# Patient Record
Sex: Female | Born: 1970
Health system: Southern US, Community
[De-identification: ages and names within clinical notes are randomized; demographics above are authoritative.]

## PROBLEM LIST (undated history)

## (undated) DIAGNOSIS — R87629 Unspecified abnormal cytological findings in specimens from vagina: Secondary | ICD-10-CM

## (undated) DIAGNOSIS — R011 Cardiac murmur, unspecified: Secondary | ICD-10-CM

## (undated) DIAGNOSIS — R87619 Unspecified abnormal cytological findings in specimens from cervix uteri: Secondary | ICD-10-CM

## (undated) DIAGNOSIS — L121 Cicatricial pemphigoid: Secondary | ICD-10-CM

## (undated) DIAGNOSIS — E039 Hypothyroidism, unspecified: Secondary | ICD-10-CM

## (undated) DIAGNOSIS — T8859XA Other complications of anesthesia, initial encounter: Secondary | ICD-10-CM

## (undated) DIAGNOSIS — I341 Nonrheumatic mitral (valve) prolapse: Secondary | ICD-10-CM

## (undated) DIAGNOSIS — T4145XA Adverse effect of unspecified anesthetic, initial encounter: Secondary | ICD-10-CM

## (undated) DIAGNOSIS — E079 Disorder of thyroid, unspecified: Secondary | ICD-10-CM

## (undated) DIAGNOSIS — R112 Nausea with vomiting, unspecified: Secondary | ICD-10-CM

## (undated) DIAGNOSIS — L129 Pemphigoid, unspecified: Secondary | ICD-10-CM

## (undated) DIAGNOSIS — Z9889 Other specified postprocedural states: Secondary | ICD-10-CM

## (undated) HISTORY — PX: COLPOSCOPY: SHX161

## (undated) HISTORY — DX: Unspecified abnormal cytological findings in specimens from vagina: R87.629

## (undated) HISTORY — PX: ABDOMINAL HYSTERECTOMY: SHX81

## (undated) HISTORY — PX: TUBAL LIGATION: SHX77

## (undated) HISTORY — DX: Unspecified abnormal cytological findings in specimens from cervix uteri: R87.619

## (undated) HISTORY — PX: CERVICAL DISC SURGERY: SHX588

## (undated) HISTORY — DX: Disorder of thyroid, unspecified: E07.9

## (undated) HISTORY — PX: OTHER SURGICAL HISTORY: SHX169

---
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
## 1997-04-28 DIAGNOSIS — L121 Cicatricial pemphigoid: Secondary | ICD-10-CM

## 1997-04-28 HISTORY — DX: Cicatricial pemphigoid: L12.1

## 2007-05-06 ENCOUNTER — Ambulatory Visit (HOSPITAL_COMMUNITY): Admission: RE | Admit: 2007-05-06 | Discharge: 2007-05-06 | Payer: Self-pay | Admitting: Gastroenterology

## 2009-02-12 ENCOUNTER — Ambulatory Visit (HOSPITAL_COMMUNITY): Admission: RE | Admit: 2009-02-12 | Discharge: 2009-02-12 | Payer: Self-pay | Admitting: Family Medicine

## 2009-02-19 ENCOUNTER — Encounter (HOSPITAL_COMMUNITY): Admission: RE | Admit: 2009-02-19 | Discharge: 2009-03-21 | Payer: Self-pay | Admitting: Family Medicine

## 2009-04-02 ENCOUNTER — Encounter (HOSPITAL_COMMUNITY): Admission: RE | Admit: 2009-04-02 | Discharge: 2009-04-24 | Payer: Self-pay | Admitting: Family Medicine

## 2010-01-18 ENCOUNTER — Ambulatory Visit (HOSPITAL_COMMUNITY): Admission: RE | Admit: 2010-01-18 | Discharge: 2010-01-18 | Payer: Self-pay | Admitting: Orthopedic Surgery

## 2010-02-06 ENCOUNTER — Encounter: Admission: RE | Admit: 2010-02-06 | Discharge: 2010-02-06 | Payer: Self-pay | Admitting: Neurosurgery

## 2010-03-04 ENCOUNTER — Other Ambulatory Visit: Admission: RE | Admit: 2010-03-04 | Discharge: 2010-03-04 | Payer: Self-pay | Admitting: Gynecology

## 2010-03-04 ENCOUNTER — Ambulatory Visit: Payer: Self-pay | Admitting: Women's Health

## 2010-05-28 ENCOUNTER — Other Ambulatory Visit (HOSPITAL_COMMUNITY): Payer: Self-pay | Admitting: Neurosurgery

## 2010-05-28 DIAGNOSIS — G935 Compression of brain: Secondary | ICD-10-CM

## 2010-06-04 ENCOUNTER — Ambulatory Visit (HOSPITAL_COMMUNITY)
Admission: RE | Admit: 2010-06-04 | Discharge: 2010-06-04 | Disposition: A | Discharge status: Home / Self Care | Payer: Commercial Managed Care - PPO | Source: Ambulatory Visit | Attending: Neurosurgery | Admitting: Neurosurgery

## 2010-06-04 ENCOUNTER — Ambulatory Visit (HOSPITAL_COMMUNITY)
Admission: RE | Admit: 2010-06-04 | Discharge: 2010-06-04 | Disposition: A | Discharge status: Home / Self Care | Payer: 59 | Source: Ambulatory Visit | Attending: Neurosurgery | Admitting: Neurosurgery

## 2010-06-04 ENCOUNTER — Other Ambulatory Visit (HOSPITAL_COMMUNITY): Payer: Self-pay | Admitting: Neurosurgery

## 2010-06-04 DIAGNOSIS — R51 Headache: Secondary | ICD-10-CM | POA: Insufficient documentation

## 2010-06-04 DIAGNOSIS — G935 Compression of brain: Secondary | ICD-10-CM

## 2010-06-04 MED ORDER — GADOBENATE DIMEGLUMINE 529 MG/ML IV SOLN
13.0000 mL | Freq: Once | INTRAVENOUS | Status: AC
  Administered 2010-06-04: 13 mL via INTRAVENOUS

## 2010-07-05 ENCOUNTER — Encounter (HOSPITAL_COMMUNITY)
Admission: RE | Admit: 2010-07-05 | Discharge: 2010-07-05 | Disposition: A | Discharge status: Home / Self Care | Payer: Commercial Managed Care - PPO | Source: Ambulatory Visit | Attending: Neurosurgery | Admitting: Neurosurgery

## 2010-07-05 DIAGNOSIS — Z01812 Encounter for preprocedural laboratory examination: Secondary | ICD-10-CM | POA: Insufficient documentation

## 2010-07-05 LAB — CBC
MCH: 29.8 pg (ref 26.0–34.0)
MCHC: 33.4 g/dL (ref 30.0–36.0)
MCV: 89.1 fL (ref 78.0–100.0)
Platelets: 382 10*3/uL (ref 150–400)
RDW: 12.5 % (ref 11.5–15.5)

## 2010-07-10 ENCOUNTER — Ambulatory Visit (HOSPITAL_COMMUNITY)
Admission: RE | Admit: 2010-07-10 | Discharge: 2010-07-10 | Disposition: A | Discharge status: Home / Self Care | Payer: 59 | Source: Ambulatory Visit | Attending: Neurosurgery | Admitting: Neurosurgery

## 2010-07-10 ENCOUNTER — Inpatient Hospital Stay (HOSPITAL_COMMUNITY): Payer: 59

## 2010-07-10 DIAGNOSIS — Z01812 Encounter for preprocedural laboratory examination: Secondary | ICD-10-CM | POA: Insufficient documentation

## 2010-07-10 DIAGNOSIS — M5020 Other cervical disc displacement, unspecified cervical region: Secondary | ICD-10-CM | POA: Insufficient documentation

## 2010-07-10 DIAGNOSIS — M5030 Other cervical disc degeneration, unspecified cervical region: Secondary | ICD-10-CM | POA: Insufficient documentation

## 2010-07-22 NOTE — Op Note (Signed)
NAMEGRAE, LEATHERS             ACCOUNT NO.:  [id number]  MEDICAL RECORD NO.:  [id number]           PATIENT TYPE:  I  LOCATION:  3533                         FACILITY:  [facility]  PHYSICIAN:  Cristi Loron, M.D.DATE OF BIRTH:  1970-09-29  DATE OF PROCEDURE:  07/10/2010 DATE OF DISCHARGE:  07/10/2010                              OPERATIVE REPORT   BRIEF HISTORY:  The patient is a 40 year old white female who suffered from chronic neck pain, arm pain, etc.  She has failed medical management and was worked up with a cervical MRI which demonstrated the patient had herniated disk and disk degeneration at C5-6 and C6-7.  I discussed various treatment options with the patient including surgery. She has weighed the risks, benefits, and alternatives to surgery and decided to proceed with the C5-6 and C6-7 anterior cervical diskectomy and fusion and plating.  PREOPERATIVE DIAGNOSES:  C5-6 and C6-7 herniated nucleus pulposus, cervicalgia, cervical radiculopathy.  POSTOPERATIVE DIAGNOSES:  C5-6 and C6-7 herniated nucleus pulposus, cervicalgia, cervical radiculopathy.  PROCEDURE:  C5-6 and C6-7 extensive anterior cervical diskectomy/decompression; C5-6 and C6-7 anterior interbody arthrodesis with local morselized autograft bone and Actifuse bone graft extender; insertion of C5-6 and C6-7 interbody prosthesis (Zimmer PEEK interbody prosthesis); and C5 to C7 anterior cervical plating with Globus titanium plate and screws.  SURGEON:  Cristi Loron, MD  ASSISTANT:  Hewitt Shorts, MD  ANESTHESIA:  General endotracheal.  ESTIMATED BLOOD LOSS:  100 mL.  SPECIMENS:  None.  DRAINS:  None.  COMPLICATIONS:  None.  DESCRIPTION OF PROCEDURE:  The patient was brought to the operating room by Anesthesia team.  General endotracheal anesthesia was induced.  The patient remained in supine position.  A roll was placed under shoulders deeper neck and in neutral position.  Her  anterior cervical region was then prepared with Betadine scrub and Betadine solution.  Sterile drapes were applied.  I then injected the area to be incised with Marcaine with epinephrine solution.  I used a scalpel to make a transverse incision in the patient's left anterior neck.  I used a Metzenbaum scissors to divide the patient's platysma muscle and then to dissect medial to sternocleidomastoid muscle, jugular vein, and carotid artery.  I carefully dissected down towards the anterior cervical spine identifying the esophagus retracting it medially.  I then used Kittner swab to clear soft tissue from the anterior cervical spine.  I then inserted a bent spinal needle into the upper exposed intervertebral disk space.  We obtained intraoperative radiograph to confirm our location.  We then used electrocautery to detach the medial border of the longus colli muscle bilaterally from C5-6 and C6-7 intervertebral disk space.  We inserted the Caspar self-retaining retractor underneath the longus colli muscle bilaterally to provide exposure. We being began decompression at C5-6.  We incised the C5-6 intervertebral disk with 15-blade scalpel and performed a partial intervertebral diskectomy with the pituitary forceps and the Carlen curettes.  We then inserted distraction screws at C6 and C7, distracted interspace, and then used high-speed drill to decorticate the vertebral endplates at C6-7, to drill away the remainder of C6-7 intervertebral disk, to drill  away some posterior spondylosis, and to thin out the posterior longitudinal ligament.  We then incised ligament with arachnoid knife and then removed it with a Kerrison punch undercutting the vertebral endplates at C6-7 decompressing the thecal sac.  We then performed foraminotomies about the bilateral C7 nerve root, completing decompression at this level.  We now turned attention to the arthrodesis.  I used trial spacers and determined to use  6-mm interbody prosthesis at both levels.  We prefilled these prosthesis with combination of local morselized autograft bone that we obtained during decompression as well as Actifuse bone graft extender.  We inserted the prosthesis and distracted interspaces at C5-6 and C6-7.  We then removed distraction screws. There was a good snug fit of the prosthesis at both levels.  We now turned our attention to the anterior spinal instrumentation.  We selected appropriate length Globus titanium plate and laid it along the anterior aspect of the vertebral bodies from C5 to C7.  We then drilled two 12-mm holes at C5, C6, and C7 and secured the plate to the vertebral bodies by placing two 12-mm self-tapping screws at C5, C6, and C7.  We got good bony purchase with each screw.  We then obtained intraoperative radiograph which demonstrated good position of plate, screws, and prosthesis.  We then secured the plate by locking each cam.  We then obtained hemostasis using bipolar electrocautery.  We irrigated the wound out with bacitracin solution.  We then removed the retractor. We inspected esophagus for any damage, none was not apparent.  We then reapproximated the patient's platysma muscle with interrupted 3-0 Vicryl suture, subcutaneous tissue with interrupted 3-0 Vicryl suture, and skin with Steri-Strips and Benzoin.  The wound was then coated with bacitracin ointment and a sterile dressing was applied.  The drapes were removed.  The patient was subsequently extubated by Anesthesia team and transported to Post Anesthesia Care Unit in stable condition.  All sponge, instrument, and needle counts were correct at the end of this case.     Cristi Loron, M.D.     JDJ/MEDQ  D:  07/10/2010  T:  07/11/2010  Job:  [id number]  Electronically Signed by Tressie Stalker M.D. on 07/22/2010 09:55:27 AM

## 2010-09-10 NOTE — Op Note (Signed)
[name], April Jordan             ACCOUNT NO.:  [id number]   MEDICAL RECORD NO.:  [id number]          PATIENT TYPE:  AMB   LOCATION:  ENDO                         FACILITY:  [facility]   PHYSICIAN:  John C. Madilyn Fireman, M.D.    DATE OF BIRTH:  April 28, 1971   DATE OF PROCEDURE:  05/06/2007  DATE OF DISCHARGE:                               OPERATIVE REPORT   INDICATION FOR PROCEDURE:  This patient with bullous pemphigoid who has  had oral lesions and recently had some dysphagia or odynophagia with  suspicion of this possibly representing pemphigoid lesions in her upper  esophagus.   PROCEDURE:  The patient was placed in the left lateral decubitus  position and placed on the pulse monitor with continuous low-flow oxygen  delivered by nasal cannula.  She was sedated with 100 mcg IV fentanyl  and 10 mg IV Versed.  The Olympus video endoscope was advanced under  direct vision into the oropharynx and esophagus.  The esophagus was  straight and of normal caliber with the squamocolumnar line at 38 cm.  There was no visible hiatal hernia, ring, stricture or other abnormality  of the GE junction.  In the proximal esophagus, there were a few,  probably no more than 6-7, white, raised plaques that were semi  adherent, consistent visually with Candida.  I did not see any other  mucosal disruption to suggest pemphigoid, and the mucosa otherwise  appeared normal.  There was no upper esophageal ring or stricture or  diverticulum appreciated.  Brushings were taken of the apparent candidal  plaques, and the scope was then withdrawn.  The stomach and duodenum  were inspected and appeared to be within normal limits.  The patient was  then returned to the recovery room in stable condition.  She tolerated  the procedure well, and there were no immediate complications.   IMPRESSION:  Apparent mild candidal esophagitis consistent with prior  corticosteroid use for attempted pomphoid.   PLAN:  Await brushings, and we  will go ahead and start her on a course  of Diflucan.           ______________________________  Everardo All. Madilyn Fireman, M.D.     JCH/MEDQ  D:  05/06/2007  T:  05/06/2007  Job:  [id number]   cc:   Dr. Isaac Bliss

## 2011-01-16 LAB — BODY FLUID CULTURE: Gram Stain: NONE SEEN

## 2011-07-23 ENCOUNTER — Emergency Department (HOSPITAL_COMMUNITY)
Admission: EM | Admit: 2011-07-23 | Discharge: 2011-07-23 | Disposition: A | Discharge status: Home / Self Care | Payer: 59 | Source: Home / Self Care | Attending: Emergency Medicine | Admitting: Emergency Medicine

## 2011-07-23 ENCOUNTER — Encounter (HOSPITAL_COMMUNITY): Payer: Self-pay | Admitting: *Deleted

## 2011-07-23 DIAGNOSIS — J019 Acute sinusitis, unspecified: Secondary | ICD-10-CM

## 2011-07-23 DIAGNOSIS — H68012 Acute Eustachian salpingitis, left ear: Secondary | ICD-10-CM

## 2011-07-23 DIAGNOSIS — J309 Allergic rhinitis, unspecified: Secondary | ICD-10-CM

## 2011-07-23 DIAGNOSIS — H68019 Acute Eustachian salpingitis, unspecified ear: Secondary | ICD-10-CM

## 2011-07-23 MED ORDER — FLUTICASONE PROPIONATE 50 MCG/ACT NA SUSP: 2.0000 | Freq: Every day | NASAL | Status: AC

## 2011-07-23 MED ORDER — AMOXICILLIN-POT CLAVULANATE 875-125 MG PO TABS: 1.0000 | ORAL_TABLET | Freq: Two times a day (BID) | ORAL | Status: AC

## 2011-07-23 NOTE — ED Provider Notes (Signed)
Chief Complaint  Patient presents with  . Otalgia  . Nasal Congestion    History of Present Illness:   The patient is a 41 year old female with a two-week history of nasal congestion earache. She has a history of allergies. Her left ear has felt congested and slightly painful. There has been no drainage. She also describes nasal congestion with yellowish drainage, sinus pressure, headache, sneezing, itchy, watery eyes. She denies fever, chills, sore throat, or cough.  Review of Systems:  Other than noted above, the patient denies any of the following symptoms. Systemic:  No fever, chills, sweats, fatigue, myalgias, headache, or anorexia. Eye:  No redness, pain or drainage. ENT:  No earache, nasal congestion, rhinorrhea, sinus pressure, or sore throat. Lungs:  No cough, sputum production, wheezing, shortness of breath. Or chest pain. GI:  No nausea, vomiting, abdominal pain or diarrhea. Skin:  No rash or itching.  PMFSH:  Past medical history, family history, social history, meds, and allergies were reviewed.  Physical Exam:   Vital signs:  BP 116/67  Pulse 64  Temp(Src) 98.2 F (36.8 C) (Oral)  Resp 16  SpO2 99%  LMP 07/16/2011 General:  Alert, in no distress. Eye:  No conjunctival injection or drainage. ENT:  TMs and canals were normal, without erythema or inflammation.  Nasal mucosa was clear and uncongested, without drainage.  Mucous membranes were moist.  Pharynx was clear, without exudate or drainage.  There were no oral ulcerations or lesions. Neck:  Supple, no adenopathy, tenderness or mass. Lungs:  No respiratory distress.  Lungs were clear to auscultation, without wheezes, rales or rhonchi.  Breath sounds were clear and equal bilaterally. Heart:  Regular rhythm, without gallops, murmers or rubs. Skin:  Clear, warm, and dry, without rash or lesions.  Assessment:   Diagnoses that have been ruled out:  None  Diagnoses that are still under consideration:  None  Final  diagnoses:  Allergic rhinitis  Acute sinusitis  Acute Eustachian salpingitis, left ear      Plan:   1.  The following meds were prescribed:   New Prescriptions   AMOXICILLIN-CLAVULANATE (AUGMENTIN) 875-125 MG PER TABLET    Take 1 tablet by mouth 2 (two) times daily.   FLUTICASONE (FLONASE) 50 MCG/ACT NASAL SPRAY    Place 2 sprays into the nose daily.   2.  The patient was instructed in symptomatic care and handouts were given. 3.  The patient was told to return if becoming worse in any way, if no better in 3 or 4 days, and given some red flag symptoms that would indicate earlier return.   Reuben Likes, MD 07/23/11 2214

## 2011-07-23 NOTE — Discharge Instructions (Signed)
Allergic Rhinitis  Allergic rhinitis is when the mucous membranes in the nose respond to allergens. Allergens are particles in the air that cause your body to have an allergic reaction. This causes you to release allergic antibodies. Through a chain of events, these eventually cause you to release histamine into the blood stream (hence the use of antihistamines). Although meant to be protective to the body, it is this release that causes your discomfort, such as frequent sneezing, congestion and an itchy runny nose.    CAUSES    The pollen allergens may come from grasses, trees, and weeds. This is seasonal allergic rhinitis, or "hay fever." Other allergens cause year-round allergic rhinitis (perennial allergic rhinitis) such as house dust mite allergen, pet dander and mold spores.    SYMPTOMS     Nasal stuffiness (congestion).   Runny, itchy nose with sneezing and tearing of the eyes.   There is often an itching of the mouth, eyes and ears.  It cannot be cured, but it can be controlled with medications.  DIAGNOSIS    If you are unable to determine the offending allergen, skin or blood testing may find it.  TREATMENT     Avoid the allergen.   Medications and allergy shots (immunotherapy) can help.   Hay fever may often be treated with antihistamines in pill or nasal spray forms. Antihistamines block the effects of histamine. There are over-the-counter medicines that may help with nasal congestion and swelling around the eyes. Check with your caregiver before taking or giving this medicine.  If the treatment above does not work, there are many new medications your caregiver can prescribe. Stronger medications may be used if initial measures are ineffective. Desensitizing injections can be used if medications and avoidance fails. Desensitization is when a patient is given ongoing shots until the body becomes less sensitive to the allergen. Make sure you follow up with your caregiver if problems continue.   SEEK MEDICAL CARE IF:     You develop fever (more than 100.5 F (38.1 C).   You develop a cough that does not stop easily (persistent).   You have shortness of breath.   You start wheezing.   Symptoms interfere with normal daily activities.  Document Released: 01/07/2001 Document Revised: 04/03/2011 Document Reviewed: 07/19/2008  Exit[employer] Patient Information 2012 [employer]    Sinusitis  Sinuses are air pockets within the bones of your face. The growth of bacteria within a sinus leads to infection. The infection prevents the sinuses from draining. This infection is called sinusitis.  SYMPTOMS    There will be different areas of pain depending on which sinuses have become infected.   The maxillary sinuses often produce pain beneath the eyes.   Frontal sinusitis may cause pain in the middle of the forehead and above the eyes.  Other problems (symptoms) include:   Toothaches.   Colored, pus-like (purulent) drainage from the nose.   Swelling, warmth, and tenderness over the sinus areas may be signs of infection.  TREATMENT    Sinusitis is most often determined by an exam.X-rays may be taken. If x-rays have been taken, make sure you obtain your results or find out how you are to obtain them. Your caregiver may give you medications (antibiotics). These are medications that will help kill the bacteria causing the infection. You may also be given a medication (decongestant) that helps to reduce sinus swelling.    HOME CARE INSTRUCTIONS     Only take over-the-counter or prescription medicines for   pain, discomfort, or fever as directed by your caregiver.   Drink extra fluids. Fluids help thin the mucus so your sinuses can drain more easily.   Applying either moist heat or ice packs to the sinus areas may help relieve discomfort.   Use saline nasal sprays to help moisten your sinuses. The sprays can be found at your local drugstore.  SEEK IMMEDIATE MEDICAL CARE IF:   You have a fever.    You have increasing pain, severe headaches, or toothache.   You have nausea, vomiting, or drowsiness.   You develop unusual swelling around the face or trouble seeing.  MAKE SURE YOU:     Understand these instructions.   Will watch your condition.   Will get help right away if you are not doing well or get worse.  Document Released: 04/14/2005 Document Revised: 04/03/2011 Document Reviewed: 11/11/2006  [employer] Patient Information 2012 [employer].

## 2011-07-23 NOTE — ED Notes (Signed)
Pt with head congestion and left ear pain x 2 weeks

## 2013-02-23 ENCOUNTER — Other Ambulatory Visit (HOSPITAL_COMMUNITY): Payer: Self-pay | Admitting: Family Medicine

## 2013-02-23 DIAGNOSIS — Z139 Encounter for screening, unspecified: Secondary | ICD-10-CM

## 2013-03-07 ENCOUNTER — Ambulatory Visit (HOSPITAL_COMMUNITY)
Admission: RE | Admit: 2013-03-07 | Discharge: 2013-03-07 | Disposition: A | Discharge status: Home / Self Care | Payer: 59 | Source: Ambulatory Visit | Attending: Family Medicine | Admitting: Family Medicine

## 2013-03-07 DIAGNOSIS — Z1231 Encounter for screening mammogram for malignant neoplasm of breast: Secondary | ICD-10-CM | POA: Insufficient documentation

## 2013-03-07 DIAGNOSIS — Z139 Encounter for screening, unspecified: Secondary | ICD-10-CM

## 2013-03-10 ENCOUNTER — Encounter: Payer: Self-pay | Admitting: Women's Health

## 2013-03-10 ENCOUNTER — Other Ambulatory Visit: Payer: Self-pay | Admitting: Family Medicine

## 2013-03-10 ENCOUNTER — Other Ambulatory Visit (HOSPITAL_COMMUNITY)
Admission: RE | Admit: 2013-03-10 | Discharge: 2013-03-10 | Disposition: A | Discharge status: Home / Self Care | Payer: 59 | Source: Ambulatory Visit | Attending: Women's Health | Admitting: Women's Health

## 2013-03-10 ENCOUNTER — Ambulatory Visit (INDEPENDENT_AMBULATORY_CARE_PROVIDER_SITE_OTHER): Payer: 59 | Admitting: Women's Health

## 2013-03-10 VITALS — BP 112/70 | Ht 62.25 in | Wt 144.6 lb

## 2013-03-10 DIAGNOSIS — R928 Other abnormal and inconclusive findings on diagnostic imaging of breast: Secondary | ICD-10-CM

## 2013-03-10 DIAGNOSIS — Z01419 Encounter for gynecological examination (general) (routine) without abnormal findings: Secondary | ICD-10-CM | POA: Insufficient documentation

## 2013-03-10 DIAGNOSIS — Z1151 Encounter for screening for human papillomavirus (HPV): Secondary | ICD-10-CM | POA: Insufficient documentation

## 2013-03-10 DIAGNOSIS — E039 Hypothyroidism, unspecified: Secondary | ICD-10-CM | POA: Insufficient documentation

## 2013-03-10 NOTE — Patient Instructions (Signed)

## 2013-03-10 NOTE — Progress Notes (Signed)
April Jordan 01/19/1971 [id number]    History:    The patient presents for annual exam.  Monthly cycles/BTL. Mammogram last week breast noted to be dense and needs a ultrasound of right breast. Normal Pap history. Hypothyroid on Synthroid per primary care.   Past medical history, past surgical history, family history and social history were all reviewed and documented in the EPIC chart. Nurse in the ER. Marijean Niemann 17 has had gardasil.   ROS:  A  ROS was performed and pertinent positives and negatives are included in the history.  Exam:  Filed Vitals:   03/10/13 1409  BP: 112/70    General appearance:  Normal Head/Neck:  Normal, without cervical or supraclavicular adenopathy. Thyroid:  Symmetrical, normal in size, without palpable masses or nodularity. Respiratory  Effort:  Normal  Auscultation:  Clear without wheezing or rhonchi Cardiovascular  Auscultation:  Regular rate, without rubs, murmurs or gallops  Edema/varicosities:  Not grossly evident Abdominal  Soft,nontender, without masses, guarding or rebound.  Liver/spleen:  No organomegaly noted  Hernia:  None appreciated  Skin  Inspection:  Grossly normal  Palpation:  Grossly normal Neurologic/psychiatric  Orientation:  Normal with appropriate conversation.  Mood/affect:  Normal  Genitourinary    Breasts: Examined lying and sitting.     Right: Without masses, retractions, discharge or axillary adenopathy.     Left: Without masses, retractions, discharge or axillary adenopathy.   Inguinal/mons:  Normal without inguinal adenopathy  External genitalia:  Normal  BUS/Urethra/Skene's glands:  Normal  Bladder:  Normal  Vagina:  Normal  Cervix:  Normal  Uterus:   normal in size, shape and contour.  Midline and mobile  Adnexa/parametria:     Rt: Without masses or tenderness.   Lt: Without masses or tenderness.  Anus and perineum: Normal  Digital rectal exam: Normal sphincter tone without palpated masses or  tenderness  Assessment/Plan:  42 y.o. MWF G1P1 for annual exam with no complaints.  Normal GYN exam/BTL Hypothyroid-primary care manages labs and meds  Plan: SBE's, continue annual mammogram, instructed to call to schedule ultrasound. 3-D tomography reviewed dense breasts. Exercise, calcium rich diet, vitamin D 1000 daily encouraged. Pap. Pap normal 2011, new screening guidelines reviewed.Marland Kitchen    Harrington Challenger WHNP, 3:10 PM 03/10/2013

## 2013-03-17 ENCOUNTER — Ambulatory Visit
Admission: RE | Admit: 2013-03-17 | Discharge: 2013-03-17 | Disposition: A | Discharge status: Home / Self Care | Payer: 59 | Source: Ambulatory Visit | Attending: Family Medicine | Admitting: Family Medicine

## 2013-03-17 DIAGNOSIS — R928 Other abnormal and inconclusive findings on diagnostic imaging of breast: Secondary | ICD-10-CM

## 2013-05-29 ENCOUNTER — Encounter (HOSPITAL_COMMUNITY): Payer: Self-pay | Admitting: Emergency Medicine

## 2013-05-29 ENCOUNTER — Emergency Department (HOSPITAL_COMMUNITY)
Admission: EM | Admit: 2013-05-29 | Discharge: 2013-05-29 | Disposition: A | Discharge status: Home / Self Care | Payer: 59 | Source: Home / Self Care | Attending: Family Medicine | Admitting: Family Medicine

## 2013-05-29 DIAGNOSIS — J069 Acute upper respiratory infection, unspecified: Secondary | ICD-10-CM

## 2013-05-29 MED ORDER — HYDROCOD POLST-CHLORPHEN POLST 10-8 MG/5ML PO LQCR: 5.0000 mL | Freq: Two times a day (BID) | ORAL | Status: DC | PRN

## 2013-05-29 MED ORDER — IPRATROPIUM BROMIDE 0.06 % NA SOLN: 2.0000 | Freq: Four times a day (QID) | NASAL | Status: DC

## 2013-05-29 NOTE — ED Provider Notes (Signed)
CSN: [id number]     Arrival date & time 05/29/13  1105 History   First MD Initiated Contact with Patient 05/29/13 1128     Chief Complaint  Patient presents with  . URI   (Consider location/radiation/quality/duration/timing/severity/associated sxs/prior Treatment) Patient is a 43 y.o. female presenting with URI. The history is provided by the patient.  URI Presenting symptoms: congestion, cough and rhinorrhea   Presenting symptoms: no fever and no sore throat   Severity:  Mild Onset quality:  Gradual Duration:  6 days Progression:  Unchanged Chronicity:  New Risk factors: recent illness and sick contacts     Past Medical History  Diagnosis Date  . Thyroid disease     hypothyroid   Past Surgical History  Procedure Laterality Date  . Cervical disc surgery    . Tubal ligation    . Uterine ablation     History reviewed. No pertinent family history. History  Substance Use Topics  . Smoking status: Never Smoker   . Smokeless tobacco: Not on file  . Alcohol Use: Yes     Comment: socially   OB History   Grav Para Term Preterm Abortions TAB SAB Ect Mult Living   1 1        1      Review of Systems  Constitutional: Negative.  Negative for fever.  HENT: Positive for congestion, postnasal drip and rhinorrhea. Negative for sore throat.   Respiratory: Positive for cough. Negative for shortness of breath.   Cardiovascular: Negative.   Gastrointestinal: Negative.     Allergies  Amoxicillin and Sulfa antibiotics  Home Medications   Current Outpatient Rx  Name  Route  Sig  Dispense  Refill  . chlorpheniramine-HYDROcodone (TUSSIONEX PENNKINETIC ER) 10-8 MG/5ML LQCR   Oral   Take 5 mLs by mouth every 12 (twelve) hours as needed for cough.   115 mL   1   . ipratropium (ATROVENT) 0.06 % nasal spray   Each Nare   Place 2 sprays into both nostrils 4 (four) times daily.   15 mL   1   . levothyroxine (SYNTHROID, LEVOTHROID) 137 MCG tablet   Oral   Take 137 mcg by mouth  daily.          BP 128/88  Pulse 80  Temp(Src) 98.1 F (36.7 C) (Oral)  Resp 18  SpO2 100%  LMP 07/16/2011 Physical Exam  Nursing note and vitals reviewed. Constitutional: She is oriented to person, place, and time. She appears well-developed and well-nourished. No distress.  HENT:  Head: Normocephalic.  Right Ear: External ear normal.  Left Ear: External ear normal.  Mouth/Throat: Oropharynx is clear and moist.  Eyes: Pupils are equal, round, and reactive to light.  Neck: Normal range of motion. Neck supple.  Cardiovascular: Normal rate, regular rhythm, normal heart sounds and intact distal pulses.   Pulmonary/Chest: Effort normal and breath sounds normal.  Lymphadenopathy:    She has no cervical adenopathy.  Neurological: She is alert and oriented to person, place, and time.  Skin: Skin is warm and dry.    ED Course  Procedures (including critical care time) Labs Review Labs Reviewed - No data to display Imaging Review No results found.    MDM      Linna HoffJames D Kindl, MD 05/29/13 1154

## 2013-05-29 NOTE — Discharge Instructions (Signed)
Drink plenty of fluids as discussed, use medicine as prescribed, and mucinex or delsym for cough. Return or see your doctor if further problems °

## 2013-05-29 NOTE — ED Notes (Signed)
Pt  Reports  Symptoms  Of  Cough  /  Congestion          With  Some  Sinus  Drainage    As  Well   Pt  Reports  Symptoms  X  6  Days   persistant and  Will  Not  Clear  Up    She  Ambulated  To  Room with steady  Fluid  Gait

## 2013-06-01 DIAGNOSIS — Z0289 Encounter for other administrative examinations: Secondary | ICD-10-CM

## 2013-06-27 ENCOUNTER — Other Ambulatory Visit (HOSPITAL_COMMUNITY): Payer: Self-pay | Admitting: Family Medicine

## 2013-06-27 DIAGNOSIS — R1011 Right upper quadrant pain: Secondary | ICD-10-CM

## 2013-06-28 ENCOUNTER — Other Ambulatory Visit (HOSPITAL_COMMUNITY): Payer: Self-pay | Admitting: Family Medicine

## 2013-06-28 ENCOUNTER — Ambulatory Visit (HOSPITAL_COMMUNITY)
Admission: RE | Admit: 2013-06-28 | Discharge: 2013-06-28 | Disposition: A | Discharge status: Home / Self Care | Payer: 59 | Source: Ambulatory Visit | Attending: Family Medicine | Admitting: Family Medicine

## 2013-06-28 DIAGNOSIS — R109 Unspecified abdominal pain: Secondary | ICD-10-CM | POA: Insufficient documentation

## 2013-06-28 DIAGNOSIS — R1011 Right upper quadrant pain: Secondary | ICD-10-CM

## 2013-06-29 ENCOUNTER — Other Ambulatory Visit (HOSPITAL_COMMUNITY): Payer: 59

## 2013-07-01 ENCOUNTER — Encounter (HOSPITAL_COMMUNITY): Payer: Self-pay

## 2013-07-01 ENCOUNTER — Encounter (HOSPITAL_COMMUNITY)
Admission: RE | Admit: 2013-07-01 | Discharge: 2013-07-01 | Disposition: A | Discharge status: Home / Self Care | Payer: 59 | Source: Ambulatory Visit | Attending: Family Medicine | Admitting: Family Medicine

## 2013-07-01 DIAGNOSIS — R1011 Right upper quadrant pain: Secondary | ICD-10-CM | POA: Insufficient documentation

## 2013-07-01 MED ORDER — SINCALIDE 5 MCG IJ SOLR
INTRAMUSCULAR | Status: AC
  Administered 2013-07-01: 1.27 ug
  Filled 2013-07-01: qty 5

## 2013-07-01 MED ORDER — STERILE WATER FOR INJECTION IJ SOLN
INTRAMUSCULAR | Status: AC
  Administered 2013-07-01: 5 mL
  Filled 2013-07-01: qty 10

## 2013-07-01 MED ORDER — TECHNETIUM TC 99M MEBROFENIN IV KIT
5.0000 | PACK | Freq: Once | INTRAVENOUS | Status: AC | PRN
  Administered 2013-07-01: 5 via INTRAVENOUS

## 2013-07-08 ENCOUNTER — Encounter (HOSPITAL_COMMUNITY)
Admission: RE | Admit: 2013-07-08 | Discharge: 2013-07-08 | Disposition: A | Discharge status: Home / Self Care | Payer: 59 | Source: Ambulatory Visit | Attending: General Surgery | Admitting: General Surgery

## 2013-07-08 ENCOUNTER — Encounter (HOSPITAL_COMMUNITY): Payer: Self-pay

## 2013-07-08 ENCOUNTER — Encounter (HOSPITAL_COMMUNITY): Payer: Self-pay | Admitting: Pharmacy Technician

## 2013-07-08 DIAGNOSIS — Z01812 Encounter for preprocedural laboratory examination: Secondary | ICD-10-CM | POA: Insufficient documentation

## 2013-07-08 HISTORY — DX: Other complications of anesthesia, initial encounter: T88.59XA

## 2013-07-08 HISTORY — DX: Nausea with vomiting, unspecified: Z98.890

## 2013-07-08 HISTORY — DX: Cardiac murmur, unspecified: R01.1

## 2013-07-08 HISTORY — DX: Hypothyroidism, unspecified: E03.9

## 2013-07-08 HISTORY — DX: Adverse effect of unspecified anesthetic, initial encounter: T41.45XA

## 2013-07-08 HISTORY — DX: Other specified postprocedural states: R11.2

## 2013-07-08 HISTORY — DX: Cicatricial pemphigoid: L12.1

## 2013-07-08 HISTORY — DX: Nonrheumatic mitral (valve) prolapse: I34.1

## 2013-07-08 LAB — CBC WITH DIFFERENTIAL/PLATELET
BASOS ABS: 0 10*3/uL (ref 0.0–0.1)
Basophils Relative: 1 % (ref 0–1)
EOS ABS: 0.1 10*3/uL (ref 0.0–0.7)
EOS PCT: 2 % (ref 0–5)
HCT: 38.3 % (ref 36.0–46.0)
Hemoglobin: 12.9 g/dL (ref 12.0–15.0)
LYMPHS ABS: 1.8 10*3/uL (ref 0.7–4.0)
LYMPHS PCT: 30 % (ref 12–46)
MCH: 30.6 pg (ref 26.0–34.0)
MCHC: 33.7 g/dL (ref 30.0–36.0)
MCV: 91 fL (ref 78.0–100.0)
Monocytes Absolute: 0.6 10*3/uL (ref 0.1–1.0)
Monocytes Relative: 10 % (ref 3–12)
NEUTROS PCT: 57 % (ref 43–77)
Neutro Abs: 3.4 10*3/uL (ref 1.7–7.7)
Platelets: 425 10*3/uL — ABNORMAL HIGH (ref 150–400)
RBC: 4.21 MIL/uL (ref 3.87–5.11)
RDW: 12.8 % (ref 11.5–15.5)
WBC: 5.9 10*3/uL (ref 4.0–10.5)

## 2013-07-08 LAB — HEPATIC FUNCTION PANEL
ALK PHOS: 48 U/L (ref 39–117)
ALT: 17 U/L (ref 0–35)
AST: 22 U/L (ref 0–37)
Albumin: 4.3 g/dL (ref 3.5–5.2)
Total Bilirubin: 0.4 mg/dL (ref 0.3–1.2)
Total Protein: 8 g/dL (ref 6.0–8.3)

## 2013-07-08 LAB — BASIC METABOLIC PANEL
BUN: 7 mg/dL (ref 6–23)
CALCIUM: 9.7 mg/dL (ref 8.4–10.5)
CO2: 26 meq/L (ref 19–32)
CREATININE: 0.99 mg/dL (ref 0.50–1.10)
Chloride: 100 mEq/L (ref 96–112)
GFR calc Af Amer: 80 mL/min — ABNORMAL LOW (ref >90)
GFR calc non Af Amer: 69 mL/min — ABNORMAL LOW (ref >90)
Glucose, Bld: 89 mg/dL (ref 70–99)
Potassium: 4.9 mEq/L (ref 3.7–5.3)
Sodium: 138 mEq/L (ref 137–147)

## 2013-07-08 NOTE — H&P (Signed)
  NTS SOAP Note  Vital Signs:  Vitals as of: 07/07/2013: Systolic 129: Diastolic 81: Heart Rate 73: Temp 98.87F: Height 105ft 2in: Weight 142Lbs 0 Ounces: Pain Level 1: BMI 25.97  BMI : 25.97 kg/m2  Subjective: This 89 Years 1 Months old Female presents for of right upper quadrant abdominal pain.  Has been having right upper quadrant abdominal pain, nausea, and fatty food intolerance for some time now.  No fever, chills, jaundice.  U/S of gallbladder negative.  HIDA shows low gallbladder ejection fraction.  Review of Symptoms:  Constitutional:unremarkable      headache Eyes:unremarkable   Nose/Mouth/Throat:unremarkable Cardiovascular:  unremarkable   Respiratory:unremarkable   Gastrointestin    abdominal pain,nausea,heartburn Genitourinary:unremarkable     Musculoskeletal:unremarkable   Skin:unremarkable Hematolgic/Lymphatic:unremarkable     Allergic/Immunologic:unremarkable     Past Medical History:    Reviewed  Past Medical History  Surgical History: BTL, neck fusion Medical Problems: hypothyroidism Allergies: amoxicillin, bactrum, levaquin Medications: synthroid   Social History:Reviewed  Social History  Preferred Language: English Race:  White Ethnicity: Not Hispanic / Latino Age: [age] Years 1 Months Marital Status:  M Alcohol: occassionally Recreational drug(s): no   Smoking Status: Never smoker reviewed on 07/07/2013 Functional Status reviewed on 07/07/2013 ------------------------------------------------ Bathing: Normal Cooking: Normal Dressing: Normal Driving: Normal Eating: Normal Managing Meds: Normal Oral Care: Normal Shopping: Normal Toileting: Normal Transferring: Normal Walking: Normal Cognitive Status reviewed on 07/07/2013 ------------------------------------------------ Attention: Normal Decision Making: Normal Language: Normal Memory: Normal Motor: Normal Perception: Normal Problem Solving:  Normal Visual and Spatial: Normal   Family History:  Reviewed  Family Health History Mother, Living; Healthy; healthy Father, Living; Healthy; healthy    Objective Information: General:  Well appearing, well nourished in no distress.   no scleral icterus Heart:  RRR, no murmur Lungs:    CTA bilaterally, no wheezes, rhonchi, rales.  Breathing unlabored. Abdomen:Soft, minimal tenderness in right upper quadrant to palpation, ND, no HSM, no masses.  Assessment:Chronic cholecystitis  Diagnoses: 575.11 Chronic cholecystitis (Chronic cholecystitis)  Procedures: [id number] - OFFICE OUTPATIENT NEW 30 MINUTES    Plan:  Scheduled for laparoscopic cholecystectomy on 07/13/13.   Patient Education:Alternative treatments to surgery were discussed with patient (and family).  Risks and benefits  of procedure including bleeding, infection, hepatobiliary injury, and the possibility of an open procedure were fully explained to the patient (and family) who gave informed consent. Patient/family questions were addressed.  Follow-up:Pending Surgery

## 2013-07-08 NOTE — Patient Instructions (Signed)
April DoomJennifer S Jordan  07/08/2013   Your procedure is scheduled on:  07/13/2013  Report to [facility] at  0845  AM.  Call this number if you have problems the morning of surgery: [phone]   Remember:   Do not eat food or drink liquids after midnight.   Take these medicines the morning of surgery with A SIP OF WATER:  synthroid   Do not wear jewelry, make-up or nail polish.  Do not wear lotions, powders, or perfumes.   Do not shave 48 hours prior to surgery. Men may shave face and neck.  Do not bring valuables to the hospital.  [facility] is not responsible for any belongings or valuables.               Contacts, dentures or bridgework may not be worn into surgery.  Leave suitcase in the car. After surgery it may be brought to your room.  For patients admitted to the hospital, discharge time is determined by your treatment team.               Patients discharged the day of surgery will not be allowed to drive home.  Name and phone number of your driver: family  Special Instructions: Shower using CHG 2 nights before surgery and the night before surgery.  If you shower the day of surgery use CHG.  Use special wash - you have one bottle of CHG for all showers.  You should use approximately 1/3 of the bottle for each shower.   Please read over the following fact sheets that you were given: Pain Booklet, Coughing and Deep Breathing, Surgical Site Infection Prevention, Anesthesia Post-op Instructions and Care and Recovery After Surgery Laparoscopic Cholecystectomy Laparoscopic cholecystectomy is surgery to remove the gallbladder. The gallbladder is located in the upper right part of the abdomen, behind the liver. It is a storage sac for bile produced in the liver. Bile aids in the digestion and absorption of fats. Cholecystectomy is often done for inflammation of the gallbladder (cholecystitis). This condition is usually caused by a buildup of gallstones (cholelithiasis) in your  gallbladder. Gallstones can block the flow of bile, resulting in inflammation and pain. In severe cases, emergency surgery may be required. When emergency surgery is not required, you will have time to prepare for the procedure. Laparoscopic surgery is an alternative to open surgery. Laparoscopic surgery has a shorter recovery time. Your common bile duct may also need to be examined during the procedure. If stones are found in the common bile duct, they may be removed. LET [facility] PROVIDER KNOW ABOUT:  Any allergies you have.  All medicines you are taking, including vitamins, herbs, eye drops, creams, and over-the-counter medicines.  Previous problems you or members of your family have had with the use of anesthetics.  Any blood disorders you have.  Previous surgeries you have had.  Medical conditions you have. RISKS AND COMPLICATIONS Generally, this is a safe procedure. However, as with any procedure, complications can occur. Possible complications include:  Infection.  Damage to the common bile duct, nerves, arteries, veins, or other internal organs such as the stomach, liver, or intestines.  Bleeding.  A stone may remain in the common bile duct.  A bile leak from the cyst duct that is clipped when your gallbladder is removed.  The need to convert to open surgery, which requires a larger incision in the abdomen. This may be necessary if your surgeon thinks it is  not safe to continue with a laparoscopic procedure. BEFORE THE PROCEDURE  Ask your health care provider about changing or stopping any regular medicines. You will need to stop taking aspirin or blood thinners at least 5 days prior to surgery.  Do not eat or drink anything after midnight the night before surgery.  Let your health care provider know if you develop a cold or other infectious problem before surgery. PROCEDURE   You will be given medicine to make you sleep through the procedure (general  anesthetic). A breathing tube will be placed in your mouth.  When you are asleep, your surgeon will make several small cuts (incisions) in your abdomen.  A thin, lighted tube with a tiny camera on the end (laparoscope) is inserted through one of the small incisions. The camera on the laparoscope sends a picture to a TV screen in the operating room. This gives the surgeon a good view inside your abdomen.  A gas will be pumped into your abdomen. This expands your abdomen so that the surgeon has more room to perform the surgery.  Other tools needed for the procedure are inserted through the other incisions. The gallbladder is removed through one of the incisions.  After the removal of your gallbladder, the incisions will be closed with stitches, staples, or skin glue. AFTER THE PROCEDURE  You will be taken to a recovery area where your progress will be checked often.  You may be allowed to go home the same day if your pain is controlled and you can tolerate liquids. Document Released: 04/14/2005 Document Revised: 02/02/2013 Document Reviewed: 11/24/2012 [facility] Patient Information 2014 [place], [place]. PATIENT INSTRUCTIONS POST-ANESTHESIA  IMMEDIATELY FOLLOWING SURGERY:  Do not drive or operate machinery for the first twenty four hours after surgery.  Do not make any important decisions for twenty four hours after surgery or while taking narcotic pain medications or sedatives.  If you develop intractable nausea and vomiting or a severe headache please notify your doctor immediately.  FOLLOW-UP:  Please make an appointment with your surgeon as instructed. You do not need to follow up with anesthesia unless specifically instructed to do so.  WOUND CARE INSTRUCTIONS (if applicable):  Keep a dry clean dressing on the anesthesia/puncture wound site if there is drainage.  Once the wound has quit draining you may leave it open to air.  Generally you should leave the bandage intact for twenty four  hours unless there is drainage.  If the epidural site drains for more than 36-48 hours please call the anesthesia department.  QUESTIONS?:  Please feel free to call your physician or the hospital operator if you have any questions, and they will be happy to assist you.

## 2013-07-13 ENCOUNTER — Encounter (HOSPITAL_COMMUNITY): Payer: 59 | Admitting: Anesthesiology

## 2013-07-13 ENCOUNTER — Encounter (HOSPITAL_COMMUNITY)
Admission: RE | Disposition: A | Discharge status: Home / Self Care | Payer: Self-pay | Source: Ambulatory Visit | Attending: General Surgery

## 2013-07-13 ENCOUNTER — Ambulatory Visit (HOSPITAL_COMMUNITY): Payer: 59 | Admitting: Anesthesiology

## 2013-07-13 ENCOUNTER — Ambulatory Visit (HOSPITAL_COMMUNITY)
Admission: RE | Admit: 2013-07-13 | Discharge: 2013-07-13 | Disposition: A | Discharge status: Home / Self Care | Payer: 59 | Source: Ambulatory Visit | Attending: General Surgery | Admitting: General Surgery

## 2013-07-13 ENCOUNTER — Encounter (HOSPITAL_COMMUNITY): Payer: Self-pay | Admitting: *Deleted

## 2013-07-13 DIAGNOSIS — K811 Chronic cholecystitis: Secondary | ICD-10-CM | POA: Insufficient documentation

## 2013-07-13 HISTORY — PX: CHOLECYSTECTOMY: SHX55

## 2013-07-13 SURGERY — LAPAROSCOPIC CHOLECYSTECTOMY
Anesthesia: General | Site: Abdomen

## 2013-07-13 MED ORDER — OXYCODONE-ACETAMINOPHEN 5-325 MG PO TABS
ORAL_TABLET | ORAL | Status: AC
  Filled 2013-07-13: qty 1

## 2013-07-13 MED ORDER — POVIDONE-IODINE 10 % OINT PACKET
TOPICAL_OINTMENT | CUTANEOUS | Status: DC | PRN
  Administered 2013-07-13: 2 via TOPICAL

## 2013-07-13 MED ORDER — MIDAZOLAM HCL 2 MG/2ML IJ SOLN
1.0000 mg | INTRAMUSCULAR | Status: DC | PRN
  Administered 2013-07-13: 2 mg via INTRAVENOUS
  Filled 2013-07-13: qty 2

## 2013-07-13 MED ORDER — LIDOCAINE HCL 1 % IJ SOLN
INTRAMUSCULAR | Status: DC | PRN
  Administered 2013-07-13: 30 mg via INTRADERMAL

## 2013-07-13 MED ORDER — GLYCOPYRROLATE 0.2 MG/ML IJ SOLN
0.2000 mg | Freq: Once | INTRAMUSCULAR | Status: AC
  Administered 2013-07-13: 0.2 mg via INTRAVENOUS
  Filled 2013-07-13: qty 1

## 2013-07-13 MED ORDER — OXYCODONE-ACETAMINOPHEN 7.5-325 MG PO TABS: 1.0000 | ORAL_TABLET | ORAL | Status: DC | PRN

## 2013-07-13 MED ORDER — CHLORHEXIDINE GLUCONATE 4 % EX LIQD: 1.0000 "application " | Freq: Once | CUTANEOUS | Status: DC

## 2013-07-13 MED ORDER — FENTANYL CITRATE 0.05 MG/ML IJ SOLN
INTRAMUSCULAR | Status: AC
  Filled 2013-07-13: qty 5

## 2013-07-13 MED ORDER — PROPOFOL 10 MG/ML IV BOLUS
INTRAVENOUS | Status: DC | PRN
  Administered 2013-07-13: 150 mg via INTRAVENOUS

## 2013-07-13 MED ORDER — POVIDONE-IODINE 10 % EX OINT
TOPICAL_OINTMENT | CUTANEOUS | Status: AC
  Filled 2013-07-13: qty 2

## 2013-07-13 MED ORDER — ROCURONIUM BROMIDE 100 MG/10ML IV SOLN
INTRAVENOUS | Status: DC | PRN
  Administered 2013-07-13: 5 mg via INTRAVENOUS
  Administered 2013-07-13: 20 mg via INTRAVENOUS

## 2013-07-13 MED ORDER — FENTANYL CITRATE 0.05 MG/ML IJ SOLN
INTRAMUSCULAR | Status: DC | PRN
  Administered 2013-07-13 (×5): 50 ug via INTRAVENOUS

## 2013-07-13 MED ORDER — HEMOSTATIC AGENTS (NO CHARGE) OPTIME
TOPICAL | Status: DC | PRN
  Administered 2013-07-13: 1 via TOPICAL

## 2013-07-13 MED ORDER — ONDANSETRON HCL 4 MG/2ML IJ SOLN
4.0000 mg | Freq: Once | INTRAMUSCULAR | Status: AC | PRN
  Administered 2013-07-13: 4 mg via INTRAVENOUS

## 2013-07-13 MED ORDER — GLYCOPYRROLATE 0.2 MG/ML IJ SOLN
INTRAMUSCULAR | Status: DC | PRN
  Administered 2013-07-13: .6 mg via INTRAVENOUS

## 2013-07-13 MED ORDER — FENTANYL CITRATE 0.05 MG/ML IJ SOLN: 25.0000 ug | INTRAMUSCULAR | Status: DC | PRN

## 2013-07-13 MED ORDER — ONDANSETRON HCL 4 MG/2ML IJ SOLN
4.0000 mg | Freq: Once | INTRAMUSCULAR | Status: AC
  Administered 2013-07-13: 4 mg via INTRAVENOUS
  Filled 2013-07-13: qty 2

## 2013-07-13 MED ORDER — DEXAMETHASONE SODIUM PHOSPHATE 4 MG/ML IJ SOLN
4.0000 mg | Freq: Once | INTRAMUSCULAR | Status: AC
  Administered 2013-07-13: 4 mg via INTRAVENOUS
  Filled 2013-07-13: qty 1

## 2013-07-13 MED ORDER — BUPIVACAINE HCL (PF) 0.5 % IJ SOLN
INTRAMUSCULAR | Status: DC | PRN
  Administered 2013-07-13: 10 mL

## 2013-07-13 MED ORDER — OXYCODONE-ACETAMINOPHEN 5-325 MG PO TABS
1.0000 | ORAL_TABLET | Freq: Once | ORAL | Status: AC
  Administered 2013-07-13: 1 via ORAL

## 2013-07-13 MED ORDER — CLINDAMYCIN PHOSPHATE 600 MG/50ML IV SOLN
600.0000 mg | Freq: Once | INTRAVENOUS | Status: AC
  Administered 2013-07-13: 600 mg via INTRAVENOUS
  Filled 2013-07-13: qty 50

## 2013-07-13 MED ORDER — PROPOFOL 10 MG/ML IV EMUL
INTRAVENOUS | Status: AC
  Filled 2013-07-13: qty 20

## 2013-07-13 MED ORDER — SUCCINYLCHOLINE CHLORIDE 20 MG/ML IJ SOLN
INTRAMUSCULAR | Status: DC | PRN
  Administered 2013-07-13: 100 mg via INTRAVENOUS

## 2013-07-13 MED ORDER — SUCCINYLCHOLINE CHLORIDE 20 MG/ML IJ SOLN
INTRAMUSCULAR | Status: AC
  Filled 2013-07-13: qty 1

## 2013-07-13 MED ORDER — GLYCOPYRROLATE 0.2 MG/ML IJ SOLN
INTRAMUSCULAR | Status: AC
  Filled 2013-07-13: qty 3

## 2013-07-13 MED ORDER — SCOPOLAMINE 1 MG/3DAYS TD PT72
1.0000 | MEDICATED_PATCH | Freq: Once | TRANSDERMAL | Status: DC
  Administered 2013-07-13: 1.5 mg via TRANSDERMAL
  Filled 2013-07-13: qty 1

## 2013-07-13 MED ORDER — SODIUM CHLORIDE 0.9 % IR SOLN
Status: DC | PRN
  Administered 2013-07-13: 1000 mL

## 2013-07-13 MED ORDER — NEOSTIGMINE METHYLSULFATE 1 MG/ML IJ SOLN
INTRAMUSCULAR | Status: DC | PRN
  Administered 2013-07-13: 3 mg via INTRAVENOUS

## 2013-07-13 MED ORDER — KETOROLAC TROMETHAMINE 30 MG/ML IJ SOLN
30.0000 mg | Freq: Once | INTRAMUSCULAR | Status: AC
  Administered 2013-07-13: 30 mg via INTRAVENOUS
  Filled 2013-07-13: qty 1

## 2013-07-13 MED ORDER — LACTATED RINGERS IV SOLN
INTRAVENOUS | Status: DC
  Administered 2013-07-13: 10:00:00 via INTRAVENOUS

## 2013-07-13 MED ORDER — ONDANSETRON HCL 4 MG/2ML IJ SOLN
INTRAMUSCULAR | Status: AC
  Filled 2013-07-13: qty 2

## 2013-07-13 MED ORDER — ROCURONIUM BROMIDE 50 MG/5ML IV SOLN
INTRAVENOUS | Status: AC
  Filled 2013-07-13: qty 1

## 2013-07-13 MED ORDER — BUPIVACAINE HCL (PF) 0.5 % IJ SOLN
INTRAMUSCULAR | Status: AC
  Filled 2013-07-13: qty 30

## 2013-07-13 MED ORDER — LIDOCAINE HCL (PF) 1 % IJ SOLN
INTRAMUSCULAR | Status: AC
  Filled 2013-07-13: qty 5

## 2013-07-13 SURGICAL SUPPLY — 43 items
APPLIER CLIP LAPSCP 10X32 DD (CLIP) ×2 IMPLANT
BAG HAMPER (MISCELLANEOUS) ×2 IMPLANT
CLOTH BEACON ORANGE TIMEOUT ST (SAFETY) ×2 IMPLANT
COVER LIGHT HANDLE STERIS (MISCELLANEOUS) ×4 IMPLANT
DECANTER SPIKE VIAL GLASS SM (MISCELLANEOUS) ×2 IMPLANT
DURAPREP 26ML APPLICATOR (WOUND CARE) ×2 IMPLANT
ELECT REM PT RETURN 9FT ADLT (ELECTROSURGICAL) ×2
ELECTRODE REM PT RTRN 9FT ADLT (ELECTROSURGICAL) ×1 IMPLANT
FILTER SMOKE EVAC LAPAROSHD (FILTER) ×2 IMPLANT
FORMALIN 10 PREFIL 120ML (MISCELLANEOUS) ×2 IMPLANT
GLOVE BIO SURGEON STRL SZ7.5 (GLOVE) ×2 IMPLANT
GLOVE BIOGEL PI IND STRL 7.0 (GLOVE) ×2 IMPLANT
GLOVE BIOGEL PI IND STRL 8 (GLOVE) ×1 IMPLANT
GLOVE BIOGEL PI INDICATOR 7.0 (GLOVE) ×2
GLOVE BIOGEL PI INDICATOR 8 (GLOVE) ×1
GLOVE ECLIPSE 6.5 STRL STRAW (GLOVE) ×2 IMPLANT
GLOVE EXAM NITRILE MD LF STRL (GLOVE) ×2 IMPLANT
GLOVE SS BIOGEL STRL SZ 6.5 (GLOVE) ×1 IMPLANT
GLOVE SUPERSENSE BIOGEL SZ 6.5 (GLOVE) ×1
GOWN STRL REUS W/TWL LRG LVL3 (GOWN DISPOSABLE) ×6 IMPLANT
HEMOSTAT SNOW SURGICEL 2X4 (HEMOSTASIS) ×2 IMPLANT
INST SET LAPROSCOPIC AP (KITS) ×2 IMPLANT
IV NS IRRIG 3000ML ARTHROMATIC (IV SOLUTION) IMPLANT
KIT ROOM TURNOVER APOR (KITS) ×2 IMPLANT
MANIFOLD NEPTUNE II (INSTRUMENTS) ×2 IMPLANT
NEEDLE INSUFFLATION 14GA 120MM (NEEDLE) ×2 IMPLANT
NS IRRIG 1000ML POUR BTL (IV SOLUTION) ×2 IMPLANT
PACK LAP CHOLE LZT030E (CUSTOM PROCEDURE TRAY) ×2 IMPLANT
PAD ARMBOARD 7.5X6 YLW CONV (MISCELLANEOUS) ×2 IMPLANT
POUCH SPECIMEN RETRIEVAL 10MM (ENDOMECHANICALS) ×2 IMPLANT
SET BASIN LINEN APH (SET/KITS/TRAYS/PACK) ×2 IMPLANT
SET TUBE IRRIG SUCTION NO TIP (IRRIGATION / IRRIGATOR) IMPLANT
SLEEVE ENDOPATH XCEL 5M (ENDOMECHANICALS) ×2 IMPLANT
SPONGE GAUZE 2X2 8PLY STRL LF (GAUZE/BANDAGES/DRESSINGS) ×8 IMPLANT
STAPLER VISISTAT (STAPLE) ×2 IMPLANT
SUT VICRYL 0 UR6 27IN ABS (SUTURE) ×2 IMPLANT
TAPE CLOTH SURG 4X10 WHT LF (GAUZE/BANDAGES/DRESSINGS) ×2 IMPLANT
TROCAR ENDO BLADELESS 11MM (ENDOMECHANICALS) ×2 IMPLANT
TROCAR XCEL NON-BLD 5MMX100MML (ENDOMECHANICALS) ×2 IMPLANT
TROCAR XCEL UNIV SLVE 11M 100M (ENDOMECHANICALS) ×2 IMPLANT
TUBING INSUFFLATION (TUBING) ×2 IMPLANT
WARMER LAPAROSCOPE (MISCELLANEOUS) ×2 IMPLANT
YANKAUER SUCT 12FT TUBE ARGYLE (SUCTIONS) ×2 IMPLANT

## 2013-07-13 NOTE — Interval H&P Note (Signed)
History and Physical Interval Note:  07/13/2013 9:39 AM  April DoomJennifer S Whitelaw  has presented today for surgery, with the diagnosis of chronic cholecystitis  The various methods of treatment have been discussed with the patient and family. After consideration of risks, benefits and other options for treatment, the patient has consented to  Procedure(s): LAPAROSCOPIC CHOLECYSTECTOMY (N/A) as a surgical intervention .  The patient's history has been reviewed, patient examined, no change in status, stable for surgery.  I have reviewed the patient's chart and labs.  Questions were answered to the patient's satisfaction.     Franky MachoJENKINS,Bradyn Vassey A

## 2013-07-13 NOTE — Anesthesia Procedure Notes (Signed)
Procedure Name: Intubation Date/Time: 07/13/2013 10:14 AM Performed by: Glynn OctaveANIEL, Mellonie Guess E Pre-anesthesia Checklist: Patient identified, Patient being monitored, Timeout performed, Emergency Drugs available and Suction available Patient Re-evaluated:Patient Re-evaluated prior to inductionOxygen Delivery Method: Circle System Utilized Preoxygenation: Pre-oxygenation with 100% oxygen Intubation Type: IV induction Ventilation: Mask ventilation without difficulty Laryngoscope Size: Mac and 3 Grade View: Grade I Tube type: Oral Tube size: 7.0 mm Number of attempts: 1 Airway Equipment and Method: stylet Placement Confirmation: ETT inserted through vocal cords under direct vision,  positive ETCO2 and breath sounds checked- equal and bilateral Secured at: 21 cm Tube secured with: Tape Dental Injury: Teeth and Oropharynx as per pre-operative assessment

## 2013-07-13 NOTE — Anesthesia Preprocedure Evaluation (Signed)
Anesthesia Evaluation  Patient identified by MRN, date of birth, ID band Patient awake    Reviewed: Allergy & Precautions, H&P , NPO status , Patient's Chart, lab work & pertinent test results  History of Anesthesia Complications (+) PONV and history of anesthetic complications  Airway Mallampati: I TM Distance: >3 FB Neck ROM: Full    Dental  (+) Teeth Intact   Pulmonary neg pulmonary ROS,  breath sounds clear to auscultation        Cardiovascular + Valvular Problems/Murmurs MVP Rhythm:Regular Rate:Normal     Neuro/Psych  Headaches,    GI/Hepatic   Endo/Other  Hypothyroidism   Renal/GU      Musculoskeletal   Abdominal   Peds  Hematology   Anesthesia Other Findings   Reproductive/Obstetrics                           Anesthesia Physical Anesthesia Plan  ASA: II  Anesthesia Plan: General   Post-op Pain Management:    Induction: Intravenous  Airway Management Planned: Oral ETT  Additional Equipment:   Intra-op Plan:   Post-operative Plan: Extubation in OR  Informed Consent: I have reviewed the patients History and Physical, chart, labs and discussed the procedure including the risks, benefits and alternatives for the proposed anesthesia with the patient or authorized representative who has indicated his/her understanding and acceptance.     Plan Discussed with:   Anesthesia Plan Comments:         Anesthesia Quick Evaluation

## 2013-07-13 NOTE — Discharge Instructions (Signed)
Laparoscopic Cholecystectomy, Care After °Refer to this sheet in the next few weeks. These instructions provide you with information on caring for yourself after your procedure. Your health care provider may also give you more specific instructions. Your treatment has been planned according to current medical practices, but problems sometimes occur. Call your health care provider if you have any problems or questions after your procedure. °WHAT TO EXPECT AFTER THE PROCEDURE °After your procedure, it is typical to have the following: °· Pain at your incision sites. You will be given pain medicines to control the pain. °· Mild nausea or vomiting. This should improve after the first 24 hours. °· Bloating and possibly shoulder pain from the gas used during the procedure. This will improve after the first 24 hours. °HOME CARE INSTRUCTIONS  °· Change bandages (dressings) as directed by your health care provider. °· Keep the wound dry and clean. You may wash the wound gently with soap and water. Gently blot or dab the area dry. °· Do not take baths or use swimming pools or hot tubs for 2 weeks or until your health care provider approves. °· Only take over-the-counter or prescription medicines as directed by your health care provider. °· Continue your normal diet as directed by your health care provider. °· Do not lift anything heavier than 10 pounds (4.5 kg) until your health care provider approves. °· Do not play contact sports for 1 week or until your health care provider approves. °SEEK MEDICAL CARE IF:  °· You have redness, swelling, or increasing pain in the wound. °· You notice yellowish-white fluid (pus) coming from the wound. °· You have drainage from the wound that lasts longer than 1 day. °· You notice a bad smell coming from the wound or dressing. °· Your surgical cuts (incisions) break open. °SEEK IMMEDIATE MEDICAL CARE IF:  °· You develop a rash. °· You have difficulty breathing. °· You have chest pain. °· You  have a fever. °· You have increasing pain in the shoulders (shoulder strap areas). °· You have dizzy episodes or faint while standing. °· You have severe abdominal pain. °· You feel sick to your stomach (nauseous) or throw up (vomit) and this lasts for more than 1 day. °Document Released: 04/14/2005 Document Revised: 02/02/2013 Document Reviewed: 11/24/2012 °[employer]® Patient Information ©2014 [employer]. ° °

## 2013-07-13 NOTE — Op Note (Signed)
Patient:  April DoomJennifer S Jordan  DOB:  10/30/1970  MRN:  [id number]   Preop Diagnosis:  Chronic cholecystitis  Postop Diagnosis:  Same  Procedure:  Laparoscopic cholecystectomy  Surgeon:  Franky MachoMark Ariyon Gerstenberger, M.D.  Anes:  General endotracheal  Indications:  Patient is a 43 year old white female who presents with biliary colic secondary to chronic cholecystitis the risks and benefits of the procedure including bleeding, infection, hepatobiliary, and the possibility of an open procedure were fully explained to the patient, who gave informed consent.  Procedure note:  The patient is placed the supine position. After induction of general endotracheal anesthesia, the abdomen was prepped and draped using usual sterile technique with DuraPrep. Surgical site confirmation was performed.  An infraumbilical incision was made down to the fascia. A Veress needle was introduced into the abdominal cavity and confirmation of placement was done using the saline drop test. The abdomen was then insufflated to 16 mm mercury pressure. An 11 mm trocar was introduced into the abdominal cavity under direct visualization without difficulty. The patient was placed in reverse Trendelenburg position and additional 11 mm trocar was placed the epigastric region and 5 mm trochars were placed in the right upper quadrant and right flank regions. Liver was inspected and noted to be within normal limits. The gallbladder was retracted in a dynamic fashion in order to expose the triangle of Calot. The cystic duct was first identified. Its juncture to the infundibulum was fully identified. Endoclips were placed proximally and distally on the cystic duct, and the cystic duct was divided. This was likewise done the cystic artery. The gallbladder was then freed away from the gallbladder fossa using Bovie electrocautery. The gallbladder was delivered through the epigastric trocar site using an Endo Catch bag. The gallbladder fossa was inspected and  no abnormal bleeding or bile leakage was noted. Surgicel is placed the gallbladder fossa. All fluid and air were then evacuated from the abdominal cavity prior to removal of the trochars.  All wounds were irrigated with normal saline. All wounds were injected with 0.5% Sensorcaine. The infraumbilical fashion as well as epigastric fascia were reapproximated using 0 Vicryl interrupted sutures. All skin incisions were closed using staples. Betadine ointment and dry sterile dressings were applied.  All tape and needle counts were correct the end of the procedure. Patient was extubated in the operating room and transferred to PACU in stable condition.  Complications:  None  EBL:  Minimal  Specimen:  Gallbladder

## 2013-07-13 NOTE — Transfer of Care (Signed)
Immediate Anesthesia Transfer of Care Note  Patient: April Jordan  Procedure(s) Performed: Procedure(s): LAPAROSCOPIC CHOLECYSTECTOMY (N/A)  Patient Location: PACU  Anesthesia Type:General  Level of Consciousness: awake, alert  and oriented  Airway & Oxygen Therapy: Patient Spontanous Breathing and Patient connected to face mask oxygen  Post-op Assessment: Report given to PACU RN  Post vital signs: Reviewed and stable  Complications: No apparent anesthesia complications

## 2013-07-13 NOTE — Anesthesia Postprocedure Evaluation (Signed)
  Anesthesia Post-op Note  Patient: April Jordan  Procedure(s) Performed: Procedure(s): LAPAROSCOPIC CHOLECYSTECTOMY (N/A)  Patient Location: PACU  Anesthesia Type:General  Level of Consciousness: awake, alert  and oriented  Airway and Oxygen Therapy: Patient Spontanous Breathing and Patient connected to face mask oxygen  Post-op Pain: none  Post-op Assessment: Post-op Vital signs reviewed, Patient's Cardiovascular Status Stable, Respiratory Function Stable, Patent Airway and No signs of Nausea or vomiting  Post-op Vital Signs: Reviewed and stable  Complications: No apparent anesthesia complications

## 2013-07-14 ENCOUNTER — Encounter (HOSPITAL_COMMUNITY): Payer: Self-pay | Admitting: General Surgery

## 2014-02-27 ENCOUNTER — Encounter (HOSPITAL_COMMUNITY): Payer: Self-pay | Admitting: General Surgery

## 2014-03-07 ENCOUNTER — Other Ambulatory Visit: Payer: Self-pay

## 2014-03-07 DIAGNOSIS — Z1231 Encounter for screening mammogram for malignant neoplasm of breast: Secondary | ICD-10-CM

## 2014-04-06 ENCOUNTER — Ambulatory Visit
Admission: RE | Admit: 2014-04-06 | Discharge: 2014-04-06 | Disposition: A | Discharge status: Home / Self Care | Payer: 59 | Source: Ambulatory Visit

## 2014-04-06 DIAGNOSIS — Z1231 Encounter for screening mammogram for malignant neoplasm of breast: Secondary | ICD-10-CM

## 2014-04-18 ENCOUNTER — Encounter: Payer: Self-pay | Admitting: Women's Health

## 2014-04-18 ENCOUNTER — Ambulatory Visit (INDEPENDENT_AMBULATORY_CARE_PROVIDER_SITE_OTHER): Payer: 59 | Admitting: Women's Health

## 2014-04-18 VITALS — BP 136/78 | Ht 62.0 in | Wt 145.0 lb

## 2014-04-18 DIAGNOSIS — Z01419 Encounter for gynecological examination (general) (routine) without abnormal findings: Secondary | ICD-10-CM

## 2014-04-18 NOTE — Progress Notes (Signed)
April DoomJennifer S Crysler 12/03/1970 [id number]    History:    Presents for annual exam.  Amenorrheic 2 years/BTL/ablation 4 years ago, occasional hot flushes. Normal Pap and mammogram history. Hypothyroid primary care manages. 06/2013 cholecystectomy.  Past medical history, past surgical history, family history and social history were all reviewed and documented in the EPIC chart. Nurse in the ER. April MuirJamie 18 doing well.  ROS:  A ROS was performed and pertinent positives and negatives are included.  Exam:  Filed Vitals:   04/18/14 1359  BP: 136/78    General appearance:  Normal Thyroid:  Symmetrical, normal in size, without palpable masses or nodularity. Respiratory  Auscultation:  Clear without wheezing or rhonchi Cardiovascular  Auscultation:  Regular rate, without rubs, murmurs or gallops  Edema/varicosities:  Not grossly evident Abdominal  Soft,nontender, without masses, guarding or rebound.  Liver/spleen:  No organomegaly noted  Hernia:  None appreciated  Skin  Inspection:  Grossly normal   Breasts: Examined lying and sitting.     Right: Without masses, retractions, discharge or axillary adenopathy.     Left: Without masses, retractions, discharge or axillary adenopathy. Gentitourinary   Inguinal/mons:  Normal without inguinal adenopathy  External genitalia:  Normal  BUS/Urethra/Skene's glands:  Normal  Vagina:  Normal  Cervix:  Normal  Uterus:   normal in size, shape and contour.  Midline and mobile  Adnexa/parametria:     Rt: Without masses or tenderness.   Lt: Without masses or tenderness.  Anus and perineum: Normal  Digital rectal exam: Normal sphincter tone without palpated masses or tenderness  Assessment/Plan:  43 y.o.MWF G1P1  for annual exam with no complaints.  Amenorrheic/ablation/BTL Hypothyroid primary care manages labs and meds  Plan: SBE's, continue  annual screening mammogram, regular exercise/healthy lifestyle calcium rich diet, vitamin D 1000 daily  encouraged. UA, Pap 2014 ascus with negative HR HPV new screening guidelines reviewed.  Harrington ChallengerYOUNG,NANCY J WHNP, 5:02 PM 04/18/2014

## 2014-04-18 NOTE — Patient Instructions (Signed)

## 2014-04-19 LAB — URINALYSIS W MICROSCOPIC + REFLEX CULTURE
BACTERIA UA: NONE SEEN
BILIRUBIN URINE: NEGATIVE
CASTS: NONE SEEN
CRYSTALS: NONE SEEN
Glucose, UA: NEGATIVE mg/dL
Hgb urine dipstick: NEGATIVE
Ketones, ur: NEGATIVE mg/dL
NITRITE: NEGATIVE
PROTEIN: NEGATIVE mg/dL
Specific Gravity, Urine: <1.005 — ABNORMAL LOW (ref 1.005–1.030)
Squamous Epithelial / LPF: NONE SEEN
UROBILINOGEN UA: 0.2 mg/dL (ref 0.0–1.0)
pH: 6.5 (ref 5.0–8.0)

## 2014-04-22 LAB — URINE CULTURE

## 2015-05-15 ENCOUNTER — Other Ambulatory Visit (HOSPITAL_COMMUNITY): Payer: Self-pay | Admitting: Family Medicine

## 2015-05-15 DIAGNOSIS — Z1231 Encounter for screening mammogram for malignant neoplasm of breast: Secondary | ICD-10-CM

## 2015-05-23 ENCOUNTER — Ambulatory Visit (HOSPITAL_COMMUNITY)
Admission: RE | Admit: 2015-05-23 | Discharge: 2015-05-23 | Disposition: A | Discharge status: Home / Self Care | Payer: 59 | Source: Ambulatory Visit | Attending: Family Medicine | Admitting: Family Medicine

## 2015-05-23 DIAGNOSIS — Z1231 Encounter for screening mammogram for malignant neoplasm of breast: Secondary | ICD-10-CM | POA: Diagnosis not present

## 2016-04-22 DIAGNOSIS — J209 Acute bronchitis, unspecified: Secondary | ICD-10-CM | POA: Diagnosis not present

## 2016-04-22 DIAGNOSIS — Z6827 Body mass index (BMI) 27.0-27.9, adult: Secondary | ICD-10-CM | POA: Diagnosis not present

## 2016-05-19 ENCOUNTER — Other Ambulatory Visit (HOSPITAL_COMMUNITY): Payer: Self-pay | Admitting: Family Medicine

## 2016-05-19 DIAGNOSIS — Z1239 Encounter for other screening for malignant neoplasm of breast: Secondary | ICD-10-CM

## 2016-05-28 ENCOUNTER — Ambulatory Visit (HOSPITAL_COMMUNITY)
Admission: RE | Admit: 2016-05-28 | Discharge: 2016-05-28 | Disposition: A | Discharge status: Home / Self Care | Payer: 59 | Source: Ambulatory Visit | Attending: Family Medicine | Admitting: Family Medicine

## 2016-05-28 DIAGNOSIS — Z1231 Encounter for screening mammogram for malignant neoplasm of breast: Secondary | ICD-10-CM | POA: Diagnosis not present

## 2016-05-28 DIAGNOSIS — Z1239 Encounter for other screening for malignant neoplasm of breast: Secondary | ICD-10-CM

## 2016-05-29 DIAGNOSIS — H5203 Hypermetropia, bilateral: Secondary | ICD-10-CM | POA: Diagnosis not present

## 2016-05-29 DIAGNOSIS — H524 Presbyopia: Secondary | ICD-10-CM | POA: Diagnosis not present

## 2016-05-29 DIAGNOSIS — H52221 Regular astigmatism, right eye: Secondary | ICD-10-CM | POA: Diagnosis not present

## 2016-07-18 DIAGNOSIS — Z6826 Body mass index (BMI) 26.0-26.9, adult: Secondary | ICD-10-CM | POA: Diagnosis not present

## 2016-07-18 DIAGNOSIS — M545 Low back pain: Secondary | ICD-10-CM | POA: Diagnosis not present

## 2016-07-18 DIAGNOSIS — H1013 Acute atopic conjunctivitis, bilateral: Secondary | ICD-10-CM | POA: Diagnosis not present

## 2016-07-18 DIAGNOSIS — E039 Hypothyroidism, unspecified: Secondary | ICD-10-CM | POA: Diagnosis not present

## 2017-05-27 ENCOUNTER — Other Ambulatory Visit (HOSPITAL_COMMUNITY): Payer: Self-pay | Admitting: Family Medicine

## 2017-05-27 DIAGNOSIS — Z1231 Encounter for screening mammogram for malignant neoplasm of breast: Secondary | ICD-10-CM

## 2017-06-03 DIAGNOSIS — H52223 Regular astigmatism, bilateral: Secondary | ICD-10-CM | POA: Diagnosis not present

## 2017-06-03 DIAGNOSIS — H524 Presbyopia: Secondary | ICD-10-CM | POA: Diagnosis not present

## 2017-06-03 DIAGNOSIS — H5203 Hypermetropia, bilateral: Secondary | ICD-10-CM | POA: Diagnosis not present

## 2017-06-10 DIAGNOSIS — Z6826 Body mass index (BMI) 26.0-26.9, adult: Secondary | ICD-10-CM | POA: Diagnosis not present

## 2017-06-10 DIAGNOSIS — L309 Dermatitis, unspecified: Secondary | ICD-10-CM | POA: Diagnosis not present

## 2017-06-10 DIAGNOSIS — R197 Diarrhea, unspecified: Secondary | ICD-10-CM | POA: Diagnosis not present

## 2017-06-15 ENCOUNTER — Ambulatory Visit (HOSPITAL_COMMUNITY)
Admission: RE | Admit: 2017-06-15 | Discharge: 2017-06-15 | Disposition: A | Discharge status: Home / Self Care | Payer: 59 | Source: Ambulatory Visit | Attending: Family Medicine | Admitting: Family Medicine

## 2017-06-15 DIAGNOSIS — Z1231 Encounter for screening mammogram for malignant neoplasm of breast: Secondary | ICD-10-CM | POA: Diagnosis not present

## 2017-06-16 ENCOUNTER — Other Ambulatory Visit (HOSPITAL_COMMUNITY)
Admission: RE | Admit: 2017-06-16 | Discharge: 2017-06-16 | Disposition: A | Discharge status: Home / Self Care | Payer: 59 | Source: Ambulatory Visit | Attending: Adult Health | Admitting: Adult Health

## 2017-06-16 ENCOUNTER — Encounter: Payer: Self-pay | Admitting: Adult Health

## 2017-06-16 ENCOUNTER — Other Ambulatory Visit: Payer: Self-pay

## 2017-06-16 ENCOUNTER — Ambulatory Visit (INDEPENDENT_AMBULATORY_CARE_PROVIDER_SITE_OTHER): Payer: 59 | Admitting: Adult Health

## 2017-06-16 VITALS — BP 122/60 | HR 76 | Ht 62.0 in | Wt 145.0 lb

## 2017-06-16 DIAGNOSIS — Z1211 Encounter for screening for malignant neoplasm of colon: Secondary | ICD-10-CM

## 2017-06-16 DIAGNOSIS — Z01411 Encounter for gynecological examination (general) (routine) with abnormal findings: Secondary | ICD-10-CM

## 2017-06-16 DIAGNOSIS — R197 Diarrhea, unspecified: Secondary | ICD-10-CM

## 2017-06-16 DIAGNOSIS — Z01419 Encounter for gynecological examination (general) (routine) without abnormal findings: Secondary | ICD-10-CM | POA: Diagnosis not present

## 2017-06-16 DIAGNOSIS — Z1212 Encounter for screening for malignant neoplasm of rectum: Secondary | ICD-10-CM | POA: Diagnosis not present

## 2017-06-16 DIAGNOSIS — E041 Nontoxic single thyroid nodule: Secondary | ICD-10-CM | POA: Diagnosis not present

## 2017-06-16 DIAGNOSIS — R109 Unspecified abdominal pain: Secondary | ICD-10-CM | POA: Diagnosis not present

## 2017-06-16 DIAGNOSIS — N941 Unspecified dyspareunia: Secondary | ICD-10-CM | POA: Diagnosis not present

## 2017-06-16 LAB — HEMOCCULT GUIAC POC 1CARD (OFFICE): Fecal Occult Blood, POC: NEGATIVE

## 2017-06-16 NOTE — Progress Notes (Signed)
Patient ID: April DoomJennifer S Jordan, female   DOB: 04/24/1971, 47 y.o.   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  PCP is Dr Dimas AguasHoward.    Current Medications, Allergies, Past Medical History, Past Surgical History, Family History and Social History were reviewed [facility] electronic [facility].     Review of Systems: Patient denies any headaches, hearing loss, fatigue, blurred vision, shortness of breath, chest pain, abdominal pain, problems with bowel movements(has cramps and diarrhea at times, to see Dr Karilyn Cotaehman), urination, or intercourse(pain with sex at times, in right side). No joint pain or mood swings.    Physical Exam:BP 122/60 (BP Location: Right Arm, Patient Position: Sitting, Cuff Size: Normal)   Pulse 76   Ht 5\' 2"  (1.575 m)   Wt 145 lb (65.8 kg)   LMP 07/16/2011   BMI 26.52 kg/m  General:  Well developed, well nourished, no acute distress Skin:  Warm and dry Neck:  Midline trachea,  Right thyroid, good ROM, no lymphadenopathy Lungs; Clear to auscultation bilaterally Breast:  No dominant palpable mass, retraction, or nipple discharge Cardiovascular: Regular rate and rhythm Abdomen:  Soft, non tender, no hepatosplenomegaly Pelvic:  External genitalia is normal in appearance, no lesions.  The vagina is normal in appearance. Urethra has no lesions or masses. The cervix is bulbous. Pap with HPV performed. Uterus is felt to be normal size, shape, and contour.  No adnexal masses or tenderness noted.Bladder is non tender, no masses felt. Rectal: Good sphincter tone, no polyps, or hemorrhoids felt.  Hemoccult negative. Extremities/musculoskeletal:  No swelling or varicosities noted, no clubbing or cyanosis Psych:  No mood changes, alert and cooperative,seems happy PHQ 2 score 0. Will get US to assess thyroid nodule. If pain persists with sex, and  GI W/U negative, will get pelvic US.   Impression: 1. Encounter for gynecological examination with Papanicolaou smear of cervix   2. Screening for colorectal cancer   3. Thyroid nodule       Plan: [facility] 2/[facility] at 3[facility]  Physical in 1 year Pap in 3 if normal Mammogram yearly, had yesterday Colonoscopy at 51 or per GI Labs with PCP

## 2017-06-19 ENCOUNTER — Ambulatory Visit (HOSPITAL_COMMUNITY)
Admission: RE | Admit: 2017-06-19 | Discharge: 2017-06-19 | Disposition: A | Discharge status: Home / Self Care | Payer: 59 | Source: Ambulatory Visit | Attending: Adult Health | Admitting: Adult Health

## 2017-06-19 DIAGNOSIS — E041 Nontoxic single thyroid nodule: Secondary | ICD-10-CM | POA: Insufficient documentation

## 2017-06-19 LAB — CYTOLOGY - PAP: HPV (WINDOPATH): NOT DETECTED

## 2017-06-22 ENCOUNTER — Encounter: Payer: Self-pay | Admitting: Adult Health

## 2017-06-22 ENCOUNTER — Telehealth: Payer: Self-pay | Admitting: Adult Health

## 2017-06-22 DIAGNOSIS — R87611 Atypical squamous cells cannot exclude high grade squamous intraepithelial lesion on cytologic smear of cervix (ASC-H): Secondary | ICD-10-CM

## 2017-06-22 DIAGNOSIS — R87619 Unspecified abnormal cytological findings in specimens from cervix uteri: Secondary | ICD-10-CM

## 2017-06-22 HISTORY — DX: Unspecified abnormal cytological findings in specimens from cervix uteri: R87.619

## 2017-06-22 NOTE — Telephone Encounter (Signed)
Pt aware US [facility] and the pap showed ASCH, -HPV, will get colpo with Dr Despina HiddenEure

## 2017-07-09 ENCOUNTER — Encounter: Payer: Self-pay | Admitting: Obstetrics & Gynecology

## 2017-07-09 ENCOUNTER — Encounter: Payer: 59 | Admitting: Obstetrics & Gynecology

## 2017-07-10 ENCOUNTER — Telehealth: Payer: Self-pay | Admitting: *Deleted

## 2017-07-10 MED ORDER — ALPRAZOLAM ER 0.5 MG PO TB24: ORAL_TABLET | ORAL | 0 refills | Status: DC

## 2017-07-10 NOTE — Progress Notes (Signed)
Unfortunately the patient left just prior to me entering the room My nurse did catch her in and evidently she had something else she had to go to Her colposcopy will be rescheduled This encounter was created in error - please disregard.

## 2017-07-10 NOTE — Telephone Encounter (Signed)
Called requesting xanax before colpo, had scheduled and was here and had panic attack and left, will rx xanax .5 mg #2 take 1-2 before colpo. husband will drive her

## 2017-07-15 ENCOUNTER — Ambulatory Visit (INDEPENDENT_AMBULATORY_CARE_PROVIDER_SITE_OTHER): Payer: 59 | Admitting: Internal Medicine

## 2017-07-22 ENCOUNTER — Encounter (INDEPENDENT_AMBULATORY_CARE_PROVIDER_SITE_OTHER): Payer: Self-pay | Admitting: *Deleted

## 2017-07-22 ENCOUNTER — Encounter (INDEPENDENT_AMBULATORY_CARE_PROVIDER_SITE_OTHER): Payer: Self-pay | Admitting: Internal Medicine

## 2017-07-22 ENCOUNTER — Ambulatory Visit (INDEPENDENT_AMBULATORY_CARE_PROVIDER_SITE_OTHER): Payer: 59 | Admitting: Internal Medicine

## 2017-07-22 VITALS — BP 116/78 | HR 60 | Temp 98.0°F | Ht 62.0 in | Wt 149.0 lb

## 2017-07-22 DIAGNOSIS — R1030 Lower abdominal pain, unspecified: Secondary | ICD-10-CM | POA: Diagnosis not present

## 2017-07-22 DIAGNOSIS — R197 Diarrhea, unspecified: Secondary | ICD-10-CM

## 2017-07-22 LAB — SEDIMENTATION RATE: Sed Rate: 11 mm/h (ref 0–20)

## 2017-07-22 NOTE — Patient Instructions (Signed)
CT abdomen/pelvis with CM. Sedrate

## 2017-07-22 NOTE — Progress Notes (Signed)
   Subjective:    Patient ID: April DoomJennifer S Kukla, female    DOB: 05/02/1970, 47 y.o.   MRN: [id number]  HPI Referred by Dr. Selinda FlavinKevin Howard for chronic abdominal pain/diarrhea x 2 yrs. Pain located in the suprapubic area.  No fever. Worse since November. She has also had diarrhea. Most stools are loose. She says since she started the Nexium  He stool consistency has improved. She has increased fiber in her diet.  Her appetite is good. No weight loss. She was having up to 10 loose stools a day.   06/13/2017 H and H 12.8 and 39.3, MCV 94., total bili 0.3, ALP 49, AST 21, ALT 26.  Celiac panel negative.  ? Family hx of colon cancer in a grandmother.    Review of Systems Past Medical History:  Diagnosis Date  . Abnormal Pap smear of cervix 06/22/2017   [facility], -HPV, will get colpo  . Complication of anesthesia   . Heart murmur   . Hypothyroidism   . Mitral valve prolapse 1986  . Mucous membrane pemphigoid 1999  . PONV (postoperative nausea and vomiting)   . Thyroid disease    hypothyroid  . Vaginal Pap smear, abnormal     Past Surgical History:  Procedure Laterality Date  . CERVICAL DISC SURGERY    . CHOLECYSTECTOMY N/A 07/13/2013   Procedure: LAPAROSCOPIC CHOLECYSTECTOMY;  Surgeon: Dalia HeadingMark A Jenkins, MD;  Location: [facility];  Service: General;  Laterality: N/A;  . COLPOSCOPY    . TUBAL LIGATION    . uterine ablation      Allergies  Allergen Reactions  . Amoxicillin Rash  . Chlorhexidine Gluconate Rash  . Levaquin [Levofloxacin] Rash  . Sulfa Antibiotics Rash    Current Outpatient Medications on File Prior to Visit  Medication Sig Dispense Refill  . aspirin-acetaminophen-caffeine (EXCEDRIN MIGRAINE) 250-250-65 MG per tablet Take 2 tablets by mouth every 6 (six) hours as needed for headache.    . cyclobenzaprine (FLEXERIL) 10 MG tablet Take 10 mg by mouth at bedtime as needed for muscle spasms.    Marland Kitchen. esomeprazole (NEXIUM) 20 MG packet Take 20 mg by mouth daily before breakfast.    .  fluticasone (FLONASE) 50 MCG/ACT nasal spray Place into both nostrils daily.    Marland Kitchen. levothyroxine (SYNTHROID, LEVOTHROID) 137 MCG tablet Take 137 mcg by mouth daily.    Marland Kitchen. loratadine (CLARITIN) 10 MG tablet Take 10 mg by mouth daily.     No current facility-administered medications on file prior to visit.         Objective:   Physical Exam Blood pressure 116/78, pulse 60, temperature 98 F (36.7 C), height 5\' 2"  (1.575 m), weight 149 lb (67.6 kg), last menstrual period 07/16/2011. Alert and oriented. Skin warm and dry. Oral mucosa is moist.   . Sclera anicteric, conjunctivae is pink. Thyroid not enlarged. No cervical lymphadenopathy. Lungs clear. Heart regular rate and rhythm.  Abdomen is soft. Bowel sounds are positive. No hepatomegaly.  No masses felt. Slight suprapubic tenderness.  No edema to lower extremities.          Assessment & Plan:  Chronic abdominal pain, diarrhea. Symptoms wax and wane. Am going to get a sed rate and CT abdomen/pelvis to make sure we are not dealing with an IBD. Further recommendations to follow.

## 2017-07-27 ENCOUNTER — Ambulatory Visit (HOSPITAL_COMMUNITY): Admission: RE | Admit: 2017-07-27 | Payer: 59 | Source: Ambulatory Visit

## 2017-07-27 ENCOUNTER — Other Ambulatory Visit (INDEPENDENT_AMBULATORY_CARE_PROVIDER_SITE_OTHER): Payer: Self-pay | Admitting: Internal Medicine

## 2017-07-27 ENCOUNTER — Ambulatory Visit (HOSPITAL_COMMUNITY)
Admission: RE | Admit: 2017-07-27 | Discharge: 2017-07-27 | Disposition: A | Discharge status: Home / Self Care | Payer: 59 | Source: Ambulatory Visit | Attending: Internal Medicine | Admitting: Internal Medicine

## 2017-07-27 DIAGNOSIS — R1030 Lower abdominal pain, unspecified: Secondary | ICD-10-CM | POA: Diagnosis not present

## 2017-07-27 DIAGNOSIS — N83202 Unspecified ovarian cyst, left side: Secondary | ICD-10-CM | POA: Insufficient documentation

## 2017-07-27 MED ORDER — IOPAMIDOL (ISOVUE-300) INJECTION 61%
100.0000 mL | Freq: Once | INTRAVENOUS | Status: AC | PRN
  Administered 2017-07-27: 100 mL via INTRAVENOUS

## 2017-07-28 ENCOUNTER — Ambulatory Visit (INDEPENDENT_AMBULATORY_CARE_PROVIDER_SITE_OTHER): Payer: 59 | Admitting: Obstetrics & Gynecology

## 2017-07-28 ENCOUNTER — Encounter: Payer: Self-pay | Admitting: Obstetrics & Gynecology

## 2017-07-28 VITALS — BP 124/66 | Ht 62.0 in | Wt 149.0 lb

## 2017-07-28 DIAGNOSIS — R87611 Atypical squamous cells cannot exclude high grade squamous intraepithelial lesion on cytologic smear of cervix (ASC-H): Secondary | ICD-10-CM | POA: Diagnosis not present

## 2017-07-28 NOTE — Progress Notes (Signed)
Patient ID: Juanito DoomJennifer S Kozma, female   DOB: 08/14/1970, 47 y.o.   MRN: [id number] Colposcopy Procedure Note:  Colposcopy Procedure Note  Indications: Pap smear 1 months ago showed: ASC cannot exclude high grade lesion [facility]). The prior pap showed ASCUS with POSITIVE high risk HPV.  Prior cervical/vaginal disease: normal exam without visible pathology. Prior cervical treatment: no treatment.  Smoker:  No. New sexual partner:  No.  :  History of abnormal Pap: yes  Procedure Details  The risks and benefits of the procedure and Written informed consent obtained.  Speculum placed in vagina and excellent visualization of cervix achieved, cervix swabbed x 3 with acetic acid solution.  Findings:inadequate colposcopy Cervix: ; there are acetowhite changes with punctation and mosaicism present extending into the endocervix at 7-9 o'clock. Vaginal inspection: normal without visible lesions. Vulvar colposcopy: vulvar colposcopy not performed.  Specimens: none  Complications: .  Plan: With inadequate colposcopy and acetowhite changes with punctation and light mosaicism extending into the endocervical canal will need laser conization of the cervix for diagnostic and therapeutic purposes, scheduled 08/12/2017   Return in about 3 weeks (around 08/20/2017) for Post Op, with Dr Despina HiddenEure.

## 2017-08-06 NOTE — Patient Instructions (Signed)
April Jordan  08/06/2017     @PREFPERIOPPHARMACY @   Your procedure is scheduled on  08/12/2017 .  Report to [facility] at  1110   A.M.  Call this number if you have problems the morning of surgery:  [phone]   Remember:  Do not eat food or drink liquids after midnight.  Take these medicines the morning of surgery with A SIP OF WATER  Nexium, levothyroxine, claritin.   Do not wear jewelry, make-up or nail polish.  Do not wear lotions, powders, or perfumes, or deodorant.  Do not shave 48 hours prior to surgery.  Men may shave face and neck.  Do not bring valuables to the hospital.  [facility] is not responsible for any belongings or valuables.  Contacts, dentures or bridgework may not be worn into surgery.  Leave your suitcase in the car.  After surgery it may be brought to your room.  For patients admitted to the hospital, discharge time will be determined by your treatment team.  Patients discharged the day of surgery will not be allowed to drive home.   Name and phone number of your driver:   family Special instructions:  None  Please read over the following fact sheets that you were given. Anesthesia Post-op Instructions and Care and Recovery After Surgery       Cervical Conization Cervical conization (cone biopsy) is a procedure in which a cone-shaped portion of the cervix is cut out so that it can be examined under a microscope. The procedure is done to check for cancer cells or cells that might turn into cancer (precancerous cells). You may have this procedure if:  You have abnormal bleeding from your cervix.  You had an abnormal Pap test.  Something abnormal was seen on your cervix during an exam.  This procedure is performed in either a health care provider's office or in an operating room. Tell a health care provider about:  Any allergies you have.  All medicines you are taking, including vitamins, herbs, eye drops, creams, and  over-the-counter medicines.  Any problems you or family members have had with the use of anesthetic medicines.  Any blood disorders you have.  Any surgeries you have had.  Any medical conditions you have.  Your smoking habits.  When you normally have your period.  Whether you are pregnant or may be pregnant. What are the risks? Generally, this is a safe procedure. However, problems may occur, including:  Heavy bleeding for several days or weeks after the procedure.  Allergic reactions to medicines or dyes.  Increased risk of preterm labor in future pregnancies.  Infection (rare).  Damage to the cervix or other structures or organs (rare).  What happens before the procedure? Staying hydrated Follow instructions from your health care provider about hydration, which may include:  Up to 2 hours before the procedure - you may continue to drink clear liquids, such as water, clear fruit juice, black coffee, and plain tea.  Eating and drinking restrictions Follow instructions from your health care provider about eating and drinking, which may include:  8 hours before the procedure - stop eating heavy meals or foods such as meat, fried foods, or fatty foods.  6 hours before the procedure - stop eating light meals or foods, such as toast or cereal.  6 hours before the procedure - stop drinking milk or drinks that contain milk.  2 hours before the procedure -  stop drinking clear liquids.  General instructions  Do not douche, have sex, use tampons, or use any vaginal medicines before the procedure as told by your health care provider.  You may be asked to empty your bladder and bowel right before the procedure.  Ask your health care provider about: ? Changing or stopping your normal medicines. This is important if you take diabetes medicines or blood thinners. ? Taking medicines such as aspirin and ibuprofen. These medicines can thin your blood. Do not take these medicines  before your procedure if your doctor tells you not to.  Plan to have someone take you home from the hospital or clinic. What happens during the procedure?  To reduce your risk of infection: ? Your health care team will wash or sanitize their hands. ? Your skin will be washed with soap. ? Hair may be removed from the surgical area.  You will undress from the waist down and be given a gown to wear.  You will lie on an examining table and put your feet in stirrups.  An IV tube will be inserted into one of your veins.  You will be given one or more of the following: ? A medicine to help you relax (sedative). ? A medicine to numb the area (local anesthetic). ? A medicine to make you fall asleep (general anesthetic). ? A medicine that numbs the cervix (cervical block).  A lubricated device called a speculum will be inserted into your vagina. It will be used to spread open the walls of the vagina so your health care provider can see the inside of the vagina and cervix better.  An instrument that has a magnifying lens and a light (colposcope) will let your health care provider examine the cervix more closely.  Your health care provider will apply a solution to your cervix. This turns abnormal areas a pale color.  A tissue sample will be removed from the cervix using one of the following methods: ? The cold knife method. In this method, the tissue is cut out with a knife (scalpel). ? The loop electrosurgical excision procedure (LEEP) method. In this method, the tissue is cut out with a thin wire that can burn (cauterize) the tissue with an electrical current. ? Laser treatment method. In this method, the tissue is cut out and then cauterized with a laser beam to prevent bleeding.  Your health care provider will apply a paste over the biopsy areas to help control bleeding.  The tissue sample will be examined under a microscope. The procedure may vary among health care providers and  hospitals. What happens after the procedure?  Your blood pressure, heart rate, breathing rate, and blood oxygen level will be monitored often until the medicines you were given have worn off.  If you were given a local anesthetic, you will rest at the clinic or hospital until you are stable and feel ready to go home.  If you were given a general anesthetic, you may be monitored for a longer period of time.  You may have some cramping.  You may have bloody discharge or light to moderate bleeding.  You may have dark discharge coming from your vagina. This is from the paste used on the cervix to prevent bleeding. Summary  Cervical conization is a procedure in which a cone-shaped portion of the cervix is cut out so that it can be examined under a microscope.  The procedure is done to check for cancer cells or cells that might  turn into cancer (precancerous cells). This information is not intended to replace advice given to you by your health care provider. Make sure you discuss any questions you have with your health care provider. Document Released: 01/22/2005 Document Revised: 04/16/2016 Document Reviewed: 04/16/2016 Elsevier Interactive Patient Education  2017 [employer]  Cervical Conization, Care After This sheet gives you information about how to care for yourself after your procedure. Your doctor may also give you more specific instructions. If you have problems or questions, contact your doctor. Follow these instructions at home: Medicines  Take over-the-counter and prescription medicines only as told by your doctor.  Do not take aspirin until your doctor says it is okay.  If you take pain medicine: ? You may have constipation. To help treat this, your doctor may tell you to:  Drink enough fluid to keep your pee (urine) clear or pale yellow.  Take medicines.  Eat foods that are high in fiber. These include fresh fruits and vegetables, whole grains, bran, and  beans.  Limit foods that are high in fat and sugar. These include fried foods and sweet foods. ? Do not drive or use heavy machines. General instructions  You can eat your usual diet unless your doctor tells you not to do so.  Take showers for the first week. Do not take baths, swim, or use hot tubs until your doctor says it is okay.  Do not douche, use tampons, or have sex until your doctor says it is okay.  For 7-14 days after your procedure, avoid: ? Being very active. ? Exercising. ? Heavy lifting.  Keep all follow-up visits as told by your doctor. This is important. Contact a doctor if:  You have a rash.  You are dizzy or lightheaded.  You feel sick to your stomach (nauseous).  You throw up (vomit).  You have fluid from your vagina (vaginal discharge) that smells bad. Get help right away if:  There are blood clots coming from your vagina.  You have more bleeding than you would have in a normal period. For example, you soak a pad in less than 1 hour.  You have a fever.  You have more and more cramps.  You pass out (faint).  You have pain when peeing.  Your have a lot of pain.  Your pain gets worse.  Your pain does not get better when you take your medicine.  You have blood in your pee.  You throw up (vomit). Summary  After your procedure, take over-the-counter and prescription medicines only as told by your doctor.  Do not douche, use tampons, or have sex until your doctor says it is okay.  For about 7-14 days after your procedure, try not to exercise or lift heavy objects.  Get help right away if you have new symptoms, or if your symptoms become worse. This information is not intended to replace advice given to you by your health care provider. Make sure you discuss any questions you have with your health care provider. Document Released: 01/22/2008 Document Revised: 04/16/2016 Document Reviewed: 04/16/2016 Elsevier Interactive Patient Education   2017 [employer]  General Anesthesia, Adult General anesthesia is the use of medicines to make a person "go to sleep" (be unconscious) for a medical procedure. General anesthesia is often recommended when a procedure:  Is long.  Requires you to be still or in an unusual position.  Is major and can cause you to lose blood.  Is impossible to do without general anesthesia.  The medicines used for general anesthesia are called general anesthetics. In addition to making you sleep, the medicines:  Prevent pain.  Control your blood pressure.  Relax your muscles.  Tell a health care provider about:  Any allergies you have.  All medicines you are taking, including vitamins, herbs, eye drops, creams, and over-the-counter medicines.  Any problems you or family members have had with anesthetic medicines.  Types of anesthetics you have had in the past.  Any bleeding disorders you have.  Any surgeries you have had.  Any medical conditions you have.  Any history of heart or lung conditions, such as heart failure, sleep apnea, or chronic obstructive pulmonary disease (COPD).  Whether you are pregnant or may be pregnant.  Whether you use tobacco, alcohol, marijuana, or street drugs.  Any history of Financial [employer]anner.  Any history of depression or anxiety. What are the risks? Generally, this is a safe procedure. However, problems may occur, including:  Allergic reaction to anesthetics.  Lung and heart problems.  Inhaling food or liquids from your stomach into your lungs (aspiration).  Injury to nerves.  Waking up during your procedure and being unable to move (rare).  Extreme agitation or a state of mental confusion (delirium) when you wake up from the anesthetic.  Air in the bloodstream, which can lead to stroke.  These problems are more likely to develop if you are having a major surgery or if you have an advanced medical condition. You can prevent some of these  complications by answering all of your health care provider's questions thoroughly and by following all pre-procedure instructions. General anesthesia can cause side effects, including:  Nausea or vomiting  A sore throat from the breathing tube.  Feeling cold or shivery.  Feeling tired, washed out, or achy.  Sleepiness or drowsiness.  Confusion or agitation.  What happens before the procedure? Staying hydrated Follow instructions from your health care provider about hydration, which may include:  Up to 2 hours before the procedure - you may continue to drink clear liquids, such as water, clear fruit juice, black coffee, and plain tea.  Eating and drinking restrictions Follow instructions from your health care provider about eating and drinking, which may include:  8 hours before the procedure - stop eating heavy meals or foods such as meat, fried foods, or fatty foods.  6 hours before the procedure - stop eating light meals or foods, such as toast or cereal.  6 hours before the procedure - stop drinking milk or drinks that contain milk.  2 hours before the procedure - stop drinking clear liquids.  Medicines  Ask your health care provider about: ? Changing or stopping your regular medicines. This is especially important if you are taking diabetes medicines or blood thinners. ? Taking medicines such as aspirin and ibuprofen. These medicines can thin your blood. Do not take these medicines before your procedure if your health care provider instructs you not to. ? Taking new dietary supplements or medicines. Do not take these during the week before your procedure unless your health care provider approves them.  If you are told to take a medicine or to continue taking a medicine on the day of the procedure, take the medicine with sips of water. General instructions   Ask if you will be going home the same day, the following day, or after a longer hospital stay. ? Plan to have  someone take you home. ? Plan to have someone stay with you for the  first 24 hours after you leave the hospital or clinic.  For 3-6 weeks before the procedure, try not to use any tobacco products, such as cigarettes, chewing tobacco, and e-cigarettes.  You may brush your teeth on the morning of the procedure, but make sure to spit out the toothpaste. What happens during the procedure?  You will be given anesthetics through a mask and through an IV tube in one of your veins.  You may receive medicine to help you relax (sedative).  As soon as you are asleep, a breathing tube may be used to help you breathe.  An anesthesia specialist will stay with you throughout the procedure. He or she will help keep you comfortable and safe by continuing to give you medicines and adjusting the amount of medicine that you get. He or she will also watch your blood pressure, pulse, and oxygen levels to make sure that the anesthetics do not cause any problems.  If a breathing tube was used to help you breathe, it will be removed before you wake up. The procedure may vary among health care providers and hospitals. What happens after the procedure?  You will wake up, often slowly, after the procedure is complete, usually in a recovery area.  Your blood pressure, heart rate, breathing rate, and blood oxygen level will be monitored until the medicines you were given have worn off.  You may be given medicine to help you calm down if you feel anxious or agitated.  If you will be going home the same day, your health care provider may check to make sure you can stand, drink, and urinate.  Your health care providers will treat your pain and side effects before you go home.  Do not drive for 24 hours if you received a sedative.  You may: ? Feel nauseous and vomit. ? Have a sore throat. ? Have mental slowness. ? Feel cold or shivery. ? Feel sleepy. ? Feel tired. ? Feel sore or achy, even in parts of your body  where you did not have surgery. This information is not intended to replace advice given to you by your health care provider. Make sure you discuss any questions you have with your health care provider. Document Released: 07/22/2007 Document Revised: 09/25/2015 Document Reviewed: 03/29/2015 Elsevier Interactive Patient Education  2018 [employer]. General Anesthesia, Adult, Care After These instructions provide you with information about caring for yourself after your procedure. Your health care provider may also give you more specific instructions. Your treatment has been planned according to current medical practices, but problems sometimes occur. Call your health care provider if you have any problems or questions after your procedure. What can I expect after the procedure? After the procedure, it is common to have:  Vomiting.  A sore throat.  Mental slowness.  It is common to feel:  Nauseous.  Cold or shivery.  Sleepy.  Tired.  Sore or achy, even in parts of your body where you did not have surgery.  Follow these instructions at home: For at least 24 hours after the procedure:  Do not: ? Participate in activities where you could fall or become injured. ? Drive. ? Use heavy machinery. ? Drink alcohol. ? Take sleeping pills or medicines that cause drowsiness. ? Make important decisions or sign legal documents. ? Take care of children on your own.  Rest. Eating and drinking  If you vomit, drink water, juice, or soup when you can drink without vomiting.  Drink enough fluid to  keep your urine clear or pale yellow.  Make sure you have little or no nausea before eating solid foods.  Follow the diet recommended by your health care provider. General instructions  Have a responsible adult stay with you until you are awake and alert.  Return to your normal activities as told by your health care provider. Ask your health care provider what activities are safe for  you.  Take over-the-counter and prescription medicines only as told by your health care provider.  If you smoke, do not smoke without supervision.  Keep all follow-up visits as told by your health care provider. This is important. Contact a health care provider if:  You continue to have nausea or vomiting at home, and medicines are not helpful.  You cannot drink fluids or start eating again.  You cannot urinate after 8-12 hours.  You develop a skin rash.  You have fever.  You have increasing redness at the site of your procedure. Get help right away if:  You have difficulty breathing.  You have chest pain.  You have unexpected bleeding.  You feel that you are having a life-threatening or urgent problem. This information is not intended to replace advice given to you by your health care provider. Make sure you discuss any questions you have with your health care provider. Document Released: 07/21/2000 Document Revised: 09/17/2015 Document Reviewed: 03/29/2015 Elsevier Interactive Patient Education  [employer].

## 2017-08-10 ENCOUNTER — Other Ambulatory Visit: Payer: Self-pay | Admitting: Obstetrics & Gynecology

## 2017-08-10 ENCOUNTER — Other Ambulatory Visit: Payer: Self-pay

## 2017-08-10 ENCOUNTER — Encounter (HOSPITAL_COMMUNITY)
Admission: RE | Admit: 2017-08-10 | Discharge: 2017-08-10 | Disposition: A | Discharge status: Home / Self Care | Payer: 59 | Source: Ambulatory Visit | Attending: Obstetrics & Gynecology | Admitting: Obstetrics & Gynecology

## 2017-08-10 ENCOUNTER — Encounter (HOSPITAL_COMMUNITY): Payer: Self-pay

## 2017-08-10 DIAGNOSIS — N870 Mild cervical dysplasia: Secondary | ICD-10-CM | POA: Diagnosis not present

## 2017-08-10 DIAGNOSIS — Z881 Allergy status to other antibiotic agents status: Secondary | ICD-10-CM | POA: Diagnosis not present

## 2017-08-10 DIAGNOSIS — R011 Cardiac murmur, unspecified: Secondary | ICD-10-CM | POA: Diagnosis not present

## 2017-08-10 DIAGNOSIS — Z880 Allergy status to penicillin: Secondary | ICD-10-CM | POA: Diagnosis not present

## 2017-08-10 DIAGNOSIS — E039 Hypothyroidism, unspecified: Secondary | ICD-10-CM | POA: Diagnosis not present

## 2017-08-10 DIAGNOSIS — R87613 High grade squamous intraepithelial lesion on cytologic smear of cervix (HGSIL): Secondary | ICD-10-CM | POA: Diagnosis present

## 2017-08-10 DIAGNOSIS — Z882 Allergy status to sulfonamides status: Secondary | ICD-10-CM | POA: Diagnosis not present

## 2017-08-10 DIAGNOSIS — I341 Nonrheumatic mitral (valve) prolapse: Secondary | ICD-10-CM | POA: Diagnosis not present

## 2017-08-10 DIAGNOSIS — Z888 Allergy status to other drugs, medicaments and biological substances status: Secondary | ICD-10-CM | POA: Diagnosis not present

## 2017-08-10 HISTORY — DX: Pemphigoid, unspecified: L12.9

## 2017-08-10 LAB — COMPREHENSIVE METABOLIC PANEL
ALT: 21 U/L (ref 14–54)
ANION GAP: 11 (ref 5–15)
AST: 26 U/L (ref 15–41)
Albumin: 4.3 g/dL (ref 3.5–5.0)
Alkaline Phosphatase: 61 U/L (ref 38–126)
BUN: 16 mg/dL (ref 6–20)
CHLORIDE: 100 mmol/L — AB (ref 101–111)
CO2: 25 mmol/L (ref 22–32)
Calcium: 9.1 mg/dL (ref 8.9–10.3)
Creatinine, Ser: 1.01 mg/dL — ABNORMAL HIGH (ref 0.44–1.00)
GFR calc non Af Amer: >60 mL/min (ref >60)
Glucose, Bld: 85 mg/dL (ref 65–99)
POTASSIUM: 4 mmol/L (ref 3.5–5.1)
Sodium: 136 mmol/L (ref 135–145)
Total Bilirubin: 0.3 mg/dL (ref 0.3–1.2)
Total Protein: 7.8 g/dL (ref 6.5–8.1)

## 2017-08-10 LAB — CBC
HCT: 42.6 % (ref 36.0–46.0)
Hemoglobin: 13.5 g/dL (ref 12.0–15.0)
MCH: 30.3 pg (ref 26.0–34.0)
MCHC: 31.7 g/dL (ref 30.0–36.0)
MCV: 95.7 fL (ref 78.0–100.0)
PLATELETS: 424 10*3/uL — AB (ref 150–400)
RBC: 4.45 MIL/uL (ref 3.87–5.11)
RDW: 13.2 % (ref 11.5–15.5)
WBC: 8 10*3/uL (ref 4.0–10.5)

## 2017-08-10 LAB — RAPID HIV SCREEN (HIV 1/2 AB+AG)
HIV 1/2 ANTIBODIES: NONREACTIVE
HIV-1 P24 ANTIGEN - HIV24: NONREACTIVE

## 2017-08-10 LAB — URINALYSIS, ROUTINE W REFLEX MICROSCOPIC
BILIRUBIN URINE: NEGATIVE
Glucose, UA: NEGATIVE mg/dL
HGB URINE DIPSTICK: NEGATIVE
Ketones, ur: NEGATIVE mg/dL
NITRITE: NEGATIVE
PH: 7 (ref 5.0–8.0)
Protein, ur: NEGATIVE mg/dL
SPECIFIC GRAVITY, URINE: 1.01 (ref 1.005–1.030)

## 2017-08-10 LAB — HCG, QUANTITATIVE, PREGNANCY: hCG, Beta Chain, Quant, S: <1 m[IU]/mL (ref <5)

## 2017-08-12 ENCOUNTER — Ambulatory Visit (HOSPITAL_COMMUNITY): Payer: 59 | Admitting: Anesthesiology

## 2017-08-12 ENCOUNTER — Ambulatory Visit (HOSPITAL_COMMUNITY)
Admission: RE | Admit: 2017-08-12 | Discharge: 2017-08-12 | Disposition: A | Discharge status: Home / Self Care | Payer: 59 | Source: Ambulatory Visit | Attending: Obstetrics & Gynecology | Admitting: Obstetrics & Gynecology

## 2017-08-12 ENCOUNTER — Encounter (HOSPITAL_COMMUNITY): Payer: Self-pay | Admitting: *Deleted

## 2017-08-12 ENCOUNTER — Encounter (HOSPITAL_COMMUNITY)
Admission: RE | Disposition: A | Discharge status: Home / Self Care | Payer: Self-pay | Source: Ambulatory Visit | Attending: Obstetrics & Gynecology

## 2017-08-12 DIAGNOSIS — Z880 Allergy status to penicillin: Secondary | ICD-10-CM | POA: Insufficient documentation

## 2017-08-12 DIAGNOSIS — N870 Mild cervical dysplasia: Secondary | ICD-10-CM | POA: Insufficient documentation

## 2017-08-12 DIAGNOSIS — I341 Nonrheumatic mitral (valve) prolapse: Secondary | ICD-10-CM | POA: Diagnosis not present

## 2017-08-12 DIAGNOSIS — R011 Cardiac murmur, unspecified: Secondary | ICD-10-CM | POA: Insufficient documentation

## 2017-08-12 DIAGNOSIS — R87610 Atypical squamous cells of undetermined significance on cytologic smear of cervix (ASC-US): Secondary | ICD-10-CM | POA: Diagnosis not present

## 2017-08-12 DIAGNOSIS — Z881 Allergy status to other antibiotic agents status: Secondary | ICD-10-CM | POA: Diagnosis not present

## 2017-08-12 DIAGNOSIS — Z882 Allergy status to sulfonamides status: Secondary | ICD-10-CM | POA: Insufficient documentation

## 2017-08-12 DIAGNOSIS — E039 Hypothyroidism, unspecified: Secondary | ICD-10-CM | POA: Insufficient documentation

## 2017-08-12 DIAGNOSIS — Z888 Allergy status to other drugs, medicaments and biological substances status: Secondary | ICD-10-CM | POA: Diagnosis not present

## 2017-08-12 HISTORY — PX: CERVICAL CONIZATION W/BX: SHX1330

## 2017-08-12 SURGERY — CONE BIOPSY, CERVIX
Anesthesia: General

## 2017-08-12 MED ORDER — PROMETHAZINE HCL 25 MG/ML IJ SOLN: 6.2500 mg | INTRAMUSCULAR | Status: DC | PRN

## 2017-08-12 MED ORDER — KETOROLAC TROMETHAMINE 10 MG PO TABS: 10.0000 mg | ORAL_TABLET | Freq: Three times a day (TID) | ORAL | 0 refills | Status: DC | PRN

## 2017-08-12 MED ORDER — CLINDAMYCIN PHOSPHATE 300 MG/2ML IJ SOLN
INTRAMUSCULAR | Status: AC
  Filled 2017-08-12: qty 6

## 2017-08-12 MED ORDER — GENTAMICIN SULFATE 40 MG/ML IJ SOLN
INTRAMUSCULAR | Status: AC
  Filled 2017-08-12: qty 10

## 2017-08-12 MED ORDER — PROPOFOL 10 MG/ML IV BOLUS
INTRAVENOUS | Status: DC | PRN
  Administered 2017-08-12: 150 mg via INTRAVENOUS

## 2017-08-12 MED ORDER — LIDOCAINE HCL (CARDIAC) 20 MG/ML IV SOLN
INTRAVENOUS | Status: DC | PRN
  Administered 2017-08-12: 40 mg via INTRAVENOUS

## 2017-08-12 MED ORDER — KETOROLAC TROMETHAMINE 30 MG/ML IJ SOLN
30.0000 mg | Freq: Once | INTRAMUSCULAR | Status: AC
  Administered 2017-08-12: 30 mg via INTRAVENOUS
  Filled 2017-08-12: qty 1

## 2017-08-12 MED ORDER — ONDANSETRON 8 MG PO TBDP: 8.0000 mg | ORAL_TABLET | Freq: Three times a day (TID) | ORAL | 0 refills | Status: DC | PRN

## 2017-08-12 MED ORDER — MIDAZOLAM HCL 2 MG/2ML IJ SOLN: 0.5000 mg | Freq: Once | INTRAMUSCULAR | Status: DC | PRN

## 2017-08-12 MED ORDER — ACETIC ACID 5 % SOLN
Status: DC | PRN
  Administered 2017-08-12: 1 via TOPICAL

## 2017-08-12 MED ORDER — HYDROCODONE-ACETAMINOPHEN 5-325 MG PO TABS
1.0000 | ORAL_TABLET | Freq: Four times a day (QID) | ORAL | 0 refills | Status: DC | PRN
Start: 2017-08-12 — End: 2017-09-15

## 2017-08-12 MED ORDER — MIDAZOLAM HCL 5 MG/5ML IJ SOLN
INTRAMUSCULAR | Status: DC | PRN
  Administered 2017-08-12: 2 mg via INTRAVENOUS

## 2017-08-12 MED ORDER — ONDANSETRON HCL 4 MG/2ML IJ SOLN
INTRAMUSCULAR | Status: AC
  Filled 2017-08-12: qty 2

## 2017-08-12 MED ORDER — FENTANYL CITRATE (PF) 100 MCG/2ML IJ SOLN
INTRAMUSCULAR | Status: DC | PRN
  Administered 2017-08-12 (×2): 25 ug via INTRAVENOUS

## 2017-08-12 MED ORDER — HYDROMORPHONE HCL 1 MG/ML IJ SOLN
0.2500 mg | INTRAMUSCULAR | Status: DC | PRN
  Administered 2017-08-12: 0.5 mg via INTRAVENOUS

## 2017-08-12 MED ORDER — FENTANYL CITRATE (PF) 100 MCG/2ML IJ SOLN
INTRAMUSCULAR | Status: AC
  Filled 2017-08-12: qty 2

## 2017-08-12 MED ORDER — LACTATED RINGERS IV SOLN
INTRAVENOUS | Status: DC
  Administered 2017-08-12: 1000 mL via INTRAVENOUS

## 2017-08-12 MED ORDER — GENTAMICIN SULFATE 40 MG/ML IJ SOLN
INTRAVENOUS | Status: AC
  Administered 2017-08-12: 100 mL via INTRAVENOUS
  Filled 2017-08-12: qty 8.5

## 2017-08-12 MED ORDER — HYDROMORPHONE HCL 1 MG/ML IJ SOLN
INTRAMUSCULAR | Status: AC
  Filled 2017-08-12: qty 0.5

## 2017-08-12 MED ORDER — SUCCINYLCHOLINE CHLORIDE 20 MG/ML IJ SOLN
INTRAMUSCULAR | Status: AC
  Filled 2017-08-12: qty 3

## 2017-08-12 MED ORDER — FERRIC SUBSULFATE 259 MG/GM EX SOLN
CUTANEOUS | Status: DC | PRN
  Administered 2017-08-12: 1

## 2017-08-12 MED ORDER — DEXAMETHASONE SODIUM PHOSPHATE 4 MG/ML IJ SOLN
INTRAMUSCULAR | Status: DC | PRN
  Administered 2017-08-12: 4 mg via INTRAVENOUS

## 2017-08-12 MED ORDER — MIDAZOLAM HCL 2 MG/2ML IJ SOLN
INTRAMUSCULAR | Status: AC
  Filled 2017-08-12: qty 2

## 2017-08-12 MED ORDER — DEXAMETHASONE SODIUM PHOSPHATE 4 MG/ML IJ SOLN
INTRAMUSCULAR | Status: AC
  Filled 2017-08-12: qty 1

## 2017-08-12 MED ORDER — PROPOFOL 10 MG/ML IV BOLUS
INTRAVENOUS | Status: AC
  Filled 2017-08-12: qty 40

## 2017-08-12 MED ORDER — ACETAMINOPHEN 10 MG/ML IV SOLN: 1000.0000 mg | Freq: Once | INTRAVENOUS | Status: DC | PRN

## 2017-08-12 MED ORDER — ONDANSETRON HCL 4 MG/2ML IJ SOLN
INTRAMUSCULAR | Status: DC | PRN
  Administered 2017-08-12: 4 mg via INTRAVENOUS

## 2017-08-12 SURGICAL SUPPLY — 25 items
CLOTH BEACON ORANGE TIMEOUT ST (SAFETY) ×3 IMPLANT
COVER LIGHT HANDLE STERIS (MISCELLANEOUS) ×3 IMPLANT
ELECT REM PT RETURN 9FT ADLT (ELECTROSURGICAL) ×3
ELECTRODE REM PT RTRN 9FT ADLT (ELECTROSURGICAL) ×1 IMPLANT
GLOVE BIOGEL PI IND STRL 7.0 (GLOVE) ×1 IMPLANT
GLOVE BIOGEL PI IND STRL 8 (GLOVE) ×1 IMPLANT
GLOVE BIOGEL PI INDICATOR 7.0 (GLOVE) ×2
GLOVE BIOGEL PI INDICATOR 8 (GLOVE) ×2
GLOVE ECLIPSE 8.0 STRL XLNG CF (GLOVE) ×3 IMPLANT
GOWN STRL REUS W/TWL LRG LVL3 (GOWN DISPOSABLE) ×3 IMPLANT
GOWN STRL REUS W/TWL XL LVL3 (GOWN DISPOSABLE) ×3 IMPLANT
KIT TURNOVER KIT A (KITS) ×3 IMPLANT
LASER FIBER DISP 1000U (UROLOGICAL SUPPLIES) ×3 IMPLANT
MANIFOLD NEPTUNE II (INSTRUMENTS) ×3 IMPLANT
MARKER SKIN DUAL TIP RULER LAB (MISCELLANEOUS) ×3 IMPLANT
PACK BASIC III (CUSTOM PROCEDURE TRAY) ×2
PACK SRG BSC III STRL LF ECLPS (CUSTOM PROCEDURE TRAY) ×1 IMPLANT
PAD ARMBOARD 7.5X6 YLW CONV (MISCELLANEOUS) ×3 IMPLANT
PREFILTER SMOKE EVAC (FILTER) ×3 IMPLANT
SCOPETTES 8  STERILE (MISCELLANEOUS) ×2
SCOPETTES 8 STERILE (MISCELLANEOUS) ×1 IMPLANT
SET BASIN LINEN APH (SET/KITS/TRAYS/PACK) ×3 IMPLANT
TOWEL OR 17X26 4PK STRL BLUE (TOWEL DISPOSABLE) ×3 IMPLANT
TUBING SMOKE EVAC CO2 (TUBING) ×3 IMPLANT
WATER STERILE IRR 1000ML POUR (IV SOLUTION) ×3 IMPLANT

## 2017-08-12 NOTE — Discharge Instructions (Signed)
Cervical Laser Surgery, Care After °This sheet gives you information about how to care for yourself after your procedure. Your health care provider may also give you more specific instructions. If you have problems or questions, contact your health care provider. °What can I expect after the procedure? °After the procedure, it is common to have: °· Pain or discomfort. °· Mild cramping. °· Bleeding, spotting, or brownish discharge from your vagina. ° °Follow these instructions at home: °Activity °· Return to your normal activities as told by your health care provider. Ask your health care provider what activities are safe for you. °· Do not lift anything that is heavier than 10 lb (4.5 kg), or the limit that your health care provider tells you, until he or she says that it is safe. °· Do not have sex or put anything in your vagina until your health care provider says it is okay. °General instructions °· Take over-the-counter and prescription medicines only as told by your health care provider. °· Do not drive or use heavy machinery while taking prescription pain medicine. °· Wear sanitary pads to protect from bleeding, spotting, and discharge. °· Do not use tampons or douche until your health care provider says it is okay. °· It is up to you to get the results of your procedure. Ask your health care provider, or the department that is doing the procedure, when your results will be ready. °· Keep all follow-up visits as told by your health care provider. This is important. °Contact a health care provider if: °· Your pain or cramping does not improve. °· Your periods are more painful than usual. °· You do not get your period as expected. °Get help right away if: °· You have any symptoms of infection, such as: °? A fever. °? Chills. °? Discharge that smells bad. °· You have severe pain in your abdomen. °· You have heavy bleeding from your vagina (more than a normal period). °· You have vaginal bleeding with clumps of  blood (blood clots). °Summary °· After this procedure, it is common to have pain or discomfort and mild cramping. It is also common to have bleeding, spotting, or brownish discharge from your vagina. °· You may need to wear sanitary pads to protect from bleeding, spotting, and discharge. °· Do not have sex, use tampons, or douche until your health care provider says it is okay. °· Return to your normal activities as told by your health care provider. Ask your health care provider what activities are safe for you. °· Take over-the-counter and prescription medicines only as told by your health care provider. These include medicines for pain. °This information is not intended to replace advice given to you by your health care provider. Make sure you discuss any questions you have with your health care provider. °Document Released: 03/03/2016 Document Revised: 03/03/2016 Document Reviewed: 03/03/2016 °Elsevier Interactive Patient Education © 2018 [employer] ° °

## 2017-08-12 NOTE — Anesthesia Procedure Notes (Signed)
Procedure Name: LMA Insertion Date/Time: 08/12/2017 7:45 AM Performed by: Pernell DupreAdams, Greysin Medlen A, CRNA Pre-anesthesia Checklist: Patient identified, Emergency Drugs available, Suction available, Patient being monitored and Timeout performed Patient Re-evaluated:Patient Re-evaluated prior to induction Oxygen Delivery Method: Circle system utilized Induction Type: IV induction Ventilation: Mask ventilation without difficulty LMA: LMA inserted LMA Size: 4.0 Number of attempts: 1 Placement Confirmation: positive ETCO2 and breath sounds checked- equal and bilateral Tube secured with: Tape Dental Injury: Teeth and Oropharynx as per pre-operative assessment

## 2017-08-12 NOTE — Transfer of Care (Signed)
Immediate Anesthesia Transfer of Care Note  Patient: April OsmondJennifer S Jordan  Procedure(s) Performed: LASER CONIZATION CERVIX WITH BIOPSY (N/A )  Patient Location: PACU  Anesthesia Type:General  Level of Consciousness: awake, oriented and patient cooperative  Airway & Oxygen Therapy: Patient Spontanous Breathing and Patient connected to face mask oxygen  Post-op Assessment: Report given to RN and Post -op Vital signs reviewed and stable  Post vital signs: Reviewed and stable  Last Vitals:  Vitals Value Taken Time  BP 117/100 08/12/2017  8:41 AM  Temp    Pulse 94 08/12/2017  8:42 AM  Resp 18 08/12/2017  8:42 AM  SpO2 100 % 08/12/2017  8:42 AM  Vitals shown include unvalidated device data.  Last Pain:  Vitals:   08/12/17 [id number]  TempSrc: Oral  PainSc: 0-No pain         Complications: No apparent anesthesia complications

## 2017-08-12 NOTE — Anesthesia Postprocedure Evaluation (Signed)
Anesthesia Post Note  Patient: April OsmondJennifer S Righetti  Procedure(s) Performed: LASER CONIZATION CERVIX WITH BIOPSY (N/A )  Patient location during evaluation: PACU Anesthesia Type: General Level of consciousness: awake and alert Pain management: pain level controlled Vital Signs Assessment: post-procedure vital signs reviewed and stable Respiratory status: spontaneous breathing, nonlabored ventilation, respiratory function stable and patient connected to nasal cannula oxygen Cardiovascular status: blood pressure returned to baseline and stable Postop Assessment: no apparent nausea or vomiting Anesthetic complications: no     Last Vitals:  Vitals:   08/12/17 0840 08/12/17 0845  BP: 121/78 132/82  Pulse: 94 88  Resp: 18 14  Temp: 36.6 C   SpO2: 100% 100%    Last Pain:  Vitals:   08/12/17 0845  TempSrc:   PainSc: (P) 0-No pain                 Allena EaringJeffrey C Tierra Thoma

## 2017-08-12 NOTE — Op Note (Signed)
Preoperative diagnosis:  1. Suspected HSIL                                         2.  Inadequate colposcopyh lesion extending into the endocervical canal  Postoperative Diagnosis:  Same as above  Procedure:  Cervical conization using laser,  ablation of cervical bed using laser  Surgeon:  Rockne CoonsLuther H Eure Jr MD  Anesthesia:  Laryngeal mask airway  Findings:   Findings:inadequate colposcopy Cervix: ; there are acetowhite changes with punctation and mosaicism present extending into the endocervix at 7-9 o'clock. Vaginal inspection: normal without visible lesions. Vulvar colposcopy: vulvar colposcopy not performed.  Specimens: none  Complications: .  Plan: With inadequate colposcopy and acetowhite changes with punctation and light mosaicism extending into the endocervical canal will need laser conization of the cervix for diagnostic and therapeutic purposes, scheduled 08/12/2017    Today at the time of surgery a repeat colposcopy was performed using 3% acetic acid and the lesion was once again confirmed.  There  were no new findings today.  Description of operation:  Patient was taken to the operating room and placed in the supine position where she underwent laryngeal mask airway anesthesia.  She was then placed in the high lithotomy position using candy cane stirrups.  She was then draped out for a laser procedure.  The microscope was used and 3% acetic acid was placed on the cervix.  The holmium laser was then employed at a power of 2.5 and rates between 8 and 15.  I achieved a couple millimeter margin around lesions both at 12:00 and 6:00 the laser was used to perform a conization.  The specimen was removed and sent to pathology for evaluation.  As is always the case with laser I did achieve at an appropriate margin around the disease with shrinkage of the tissue during the procedure it may appear to be a positive lateral margin.  However the surgical margin is indeed clear.  I then used  the laser to ablate the conization bed to a depth of 5-7 mm laterally coning down to 9 mm centrally and began getting good surgical margin.  Additional hemostasis was achieved using Monsel solution.  In the conization bed was completely hemostatic.  Blood loss for the procedure was none.  The patient received gentamicin and cleocin preoperatively.  The patient was awakened from anesthesia and taken to the recovery room in good stable condition with all counts being correct.  She will be followed up in the office in one month for evaluation of the conization bed.  Lazaro ArmsLuther H Eure, MD 08/12/2017 8:29 AM

## 2017-08-12 NOTE — Anesthesia Preprocedure Evaluation (Signed)
Anesthesia Evaluation  Patient identified by MRN, date of birth, ID band Patient awake    Reviewed: Allergy & Precautions, H&P , NPO status , Patient's Chart, lab work & pertinent test results, reviewed documented beta blocker date and time   Airway Mallampati: II  TM Distance: >3 FB Neck ROM: full    Dental no notable dental hx.    Pulmonary neg pulmonary ROS,    Pulmonary exam normal breath sounds clear to auscultation       Cardiovascular Exercise Tolerance: Good negative cardio ROS   Rhythm:regular Rate:Normal     Neuro/Psych negative neurological ROS  negative psych ROS   GI/Hepatic negative GI ROS, Neg liver ROS,   Endo/Other  negative endocrine ROS  Renal/GU negative Renal ROS  negative genitourinary   Musculoskeletal   Abdominal   Peds  Hematology negative hematology ROS (+)   Anesthesia Other Findings H/O PONV stating had vision issues with scopolamine patch in the past; NPO confirmed  Reproductive/Obstetrics negative OB ROS                             Anesthesia Physical Anesthesia Plan  ASA: II  Anesthesia Plan: General   Post-op Pain Management:    Induction:   PONV Risk Score and Plan:   Airway Management Planned:   Additional Equipment:   Intra-op Plan:   Post-operative Plan:   Informed Consent: I have reviewed the patients History and Physical, chart, labs and discussed the procedure including the risks, benefits and alternatives for the proposed anesthesia with the patient or authorized representative who has indicated his/her understanding and acceptance.   Dental Advisory Given  Plan Discussed with: CRNA  Anesthesia Plan Comments:         Anesthesia Quick Evaluation

## 2017-08-12 NOTE — H&P (Signed)
Preoperative History and Physical  April Jordan is a 47 y.o. G1P1001 with No LMP recorded. Patient has had an ablation. admitted for a laser conization of the cervix for suspected HSIL of the cervix with lesion extending into canal, thus, inadequate colposcopy.  Laser conization for diagnostic and therapeutic indications  PMH:    Past Medical History:  Diagnosis Date  . Abnormal Pap smear of cervix 06/22/2017   [facility], -HPV, will get colpo  . Complication of anesthesia   . Heart murmur   . Hypothyroidism   . Mitral valve prolapse 1986  . Mucous membrane pemphigoid 1999  . Pemphigoid   . PONV (postoperative nausea and vomiting)   . Thyroid disease    hypothyroid  . Vaginal Pap smear, abnormal     PSH:     Past Surgical History:  Procedure Laterality Date  . CERVICAL DISC SURGERY    . CHOLECYSTECTOMY N/A 07/13/2013   Procedure: LAPAROSCOPIC CHOLECYSTECTOMY;  Surgeon: Dalia Heading, MD;  Location: [facility];  Service: General;  Laterality: N/A;  . COLPOSCOPY    . TUBAL LIGATION    . uterine ablation      POb/GynH:      OB History    Gravida  1   Para  1   Term  1   Preterm      AB      Living  1     SAB      TAB      Ectopic      Multiple      Live Births              SH:   Social History   Tobacco Use  . Smoking status: Never Smoker  . Smokeless tobacco: Never Used  Substance Use Topics  . Alcohol use: Yes    Comment: socially  . Drug use: No    FH:    Family History  Problem Relation Age of Onset  . Hypertension Mother   . Hypertension Father   . Hypertension Paternal Grandmother      Allergies:  Allergies  Allergen Reactions  . Ciprofloxacin Other (See Comments)    Unknown  . Amoxicillin Rash  . Chlorhexidine Gluconate Rash  . Levaquin [Levofloxacin] Rash  . Sulfa Antibiotics Rash    Medications:       Current Facility-Administered Medications:  .  gentamicin (GARAMYCIN) 340 mg, clindamycin (CLEOCIN) 900 mg in dextrose  5 % 100 mL IVPB, , Intravenous, On Call to OR, Lazaro Arms, MD .  ketorolac (TORADOL) 30 MG/ML injection 30 mg, 30 mg, Intravenous, Once, Lazaro Arms, MD  Review of Systems:   Review of Systems  Constitutional: Negative for fever, chills, weight loss, malaise/fatigue and diaphoresis.  HENT: Negative for hearing loss, ear pain, nosebleeds, congestion, sore throat, neck pain, tinnitus and ear discharge.   Eyes: Negative for blurred vision, double vision, photophobia, pain, discharge and redness.  Respiratory: Negative for cough, hemoptysis, sputum production, shortness of breath, wheezing and stridor.   Cardiovascular: Negative for chest pain, palpitations, orthopnea, claudication, leg swelling and PND.  Gastrointestinal: Positive for abdominal pain. Negative for heartburn, nausea, vomiting, diarrhea, constipation, blood in stool and melena.  Genitourinary: Negative for dysuria, urgency, frequency, hematuria and flank pain.  Musculoskeletal: Negative for myalgias, back pain, joint pain and falls.  Skin: Negative for itching and rash.  Neurological: Negative for dizziness, tingling, tremors, sensory change, speech change, focal weakness, seizures, loss of consciousness, weakness and headaches.  Endo/Heme/Allergies: Negative for environmental allergies and polydipsia. Does not bruise/bleed easily.  Psychiatric/Behavioral: Negative for depression, suicidal ideas, hallucinations, memory loss and substance abuse. The patient is not nervous/anxious and does not have insomnia.      PHYSICAL EXAM:  Blood pressure 122/84, temperature 98.1 F (36.7 C), temperature source Oral, resp. rate 18, SpO2 100 %.    Vitals reviewed. Constitutional: She is oriented to person, place, and time. She appears well-developed and well-nourished.  HENT:  Head: Normocephalic and atraumatic.  Right Ear: External ear normal.  Left Ear: External ear normal.  Nose: Nose normal.  Mouth/Throat: Oropharynx is clear  and moist.  Eyes: Conjunctivae and EOM are normal. Pupils are equal, round, and reactive to light. Right eye exhibits no discharge. Left eye exhibits no discharge. No scleral icterus.  Neck: Normal range of motion. Neck supple. No tracheal deviation present. No thyromegaly present.  Cardiovascular: Normal rate, regular rhythm, normal heart sounds and intact distal pulses.  Exam reveals no gallop and no friction rub.   No murmur heard. Respiratory: Effort normal and breath sounds normal. No respiratory distress. She has no wheezes. She has no rales. She exhibits no tenderness.  GI: Soft. Bowel sounds are normal. She exhibits no distension and no mass. There is tenderness. There is no rebound and no guarding.  Genitourinary:       Vulva is normal without lesions Vagina is pink moist without discharge Cervix per colpo note Uterus is normal size, contour, position, consistency, mobility, non-tender Adnexa is negative with normal sized ovaries by sonogram  Musculoskeletal: Normal range of motion. She exhibits no edema and no tenderness.  Neurological: She is alert and oriented to person, place, and time. She has normal reflexes. She displays normal reflexes. No cranial nerve deficit. She exhibits normal muscle tone. Coordination normal.  Skin: Skin is warm and dry. No rash noted. No erythema. No pallor.  Psychiatric: She has a normal mood and affect. Her behavior is normal. Judgment and thought content normal.    Labs: Results for orders placed or performed during the hospital encounter of 08/10/17 (from the past 336 hour(s))  CBC   Collection Time: 08/10/17  1:15 PM  Result Value Ref Range   WBC 8.0 4.0 - 10.5 K/uL   RBC 4.45 3.87 - 5.11 MIL/uL   Hemoglobin 13.5 12.0 - 15.0 g/dL   HCT 16.1 09.6 - 04.5 %   MCV 95.7 78.0 - 100.0 fL   MCH 30.3 26.0 - 34.0 pg   MCHC 31.7 30.0 - 36.0 g/dL   RDW 40.9 81.1 - 91.4 %   Platelets 424 (H) 150 - 400 K/uL  Comprehensive metabolic panel   Collection  Time: 08/10/17  1:15 PM  Result Value Ref Range   Sodium 136 135 - 145 mmol/L   Potassium 4.0 3.5 - 5.1 mmol/L   Chloride 100 (L) 101 - 111 mmol/L   CO2 25 22 - 32 mmol/L   Glucose, Bld 85 65 - 99 mg/dL   BUN 16 6 - 20 mg/dL   Creatinine, Ser 7.82 (H) 0.44 - 1.00 mg/dL   Calcium 9.1 8.9 - 95.6 mg/dL   Total Protein 7.8 6.5 - 8.1 g/dL   Albumin 4.3 3.5 - 5.0 g/dL   AST 26 15 - 41 U/L   ALT 21 14 - 54 U/L   Alkaline Phosphatase 61 38 - 126 U/L   Total Bilirubin 0.3 0.3 - 1.2 mg/dL   GFR calc non Af Amer >60 >60 mL/min  GFR calc Af Amer >60 >60 mL/min   Anion gap 11 5 - 15  hCG, quantitative, pregnancy   Collection Time: 08/10/17  1:15 PM  Result Value Ref Range   hCG, Beta Chain, Quant, S <1 <5 mIU/mL  Rapid HIV screen (HIV 1/2 Ab+Ag)   Collection Time: 08/10/17  1:15 PM  Result Value Ref Range   HIV-1 P24 Antigen - HIV24 NON REACTIVE NON REACTIVE   HIV 1/2 Antibodies NON REACTIVE NON REACTIVE   Interpretation (HIV Ag Ab)      A non reactive test result means that HIV 1 or HIV 2 antibodies and HIV 1 p24 antigen were not detected in the specimen.  Urinalysis, Routine w reflex microscopic   Collection Time: 08/10/17  1:15 PM  Result Value Ref Range   Color, Urine YELLOW YELLOW   APPearance HAZY (A) CLEAR   Specific Gravity, Urine 1.010 1.005 - 1.030   pH 7.0 5.0 - 8.0   Glucose, UA NEGATIVE NEGATIVE mg/dL   Hgb urine dipstick NEGATIVE NEGATIVE   Bilirubin Urine NEGATIVE NEGATIVE   Ketones, ur NEGATIVE NEGATIVE mg/dL   Protein, ur NEGATIVE NEGATIVE mg/dL   Nitrite NEGATIVE NEGATIVE   Leukocytes, UA LARGE (A) NEGATIVE   RBC / HPF 0-5 0 - 5 RBC/hpf   WBC, UA 6-30 0 - 5 WBC/hpf   Bacteria, UA RARE (A) NONE SEEN   Squamous Epithelial / LPF 0-5 (A) NONE SEEN    EKG: Orders placed or performed during the hospital encounter of 08/10/17  . EKG 12-Lead  . EKG 12-Lead    Imaging Studies: Ct Abdomen Pelvis W Contrast  Result Date: 07/27/2017 CLINICAL DATA:  LEFT lower  quadrant pain for years worse in the past few weeks EXAM: CT ABDOMEN AND PELVIS WITH CONTRAST TECHNIQUE: Multidetector CT imaging of the abdomen and pelvis was performed using the standard protocol following bolus administration of intravenous contrast. Sagittal and coronal MPR images reconstructed from axial data set. CONTRAST:  100mL ISOVUE-300 IOPAMIDOL (ISOVUE-300) INJECTION 61% IV. Dilute oral contrast. COMPARISON:  None FINDINGS: Lower chest: Lung bases clear Hepatobiliary: Gallbladder surgically absent. No significant biliary dilatation. Single tiny low-attenuation focus RIGHT lobe liver image 24 suspect tiny cyst. Pancreas: Normal appearance Spleen: Normal appearance Adrenals/Urinary Tract: Adrenal glands, kidneys, ureters, and bladder normal appearance Stomach/Bowel: Distal colon decompressed and unopacified, wall appearing minimally prominent though this could be an artifact related to underdistention. Stomach and bowel loops otherwise grossly unremarkable. Normal appendix. Vascular/Lymphatic: Vascular structures unremarkable. No adenopathy. Reproductive: LEFT ovarian cyst 3.3 x 3.1 x 3.4 cm image 71. Uterus and RIGHT ovary unremarkable Other: No free air or free fluid.  No hernia identified. Musculoskeletal: Osseous structures unremarkable. IMPRESSION: LEFT ovarian cyst 3.4 cm in greatest size. Remainder of exam shows no definite acute abnormalities. Wall of rectum and sigmoid colon is minimally prominent though this could be an artifact related underdistention; recommend correlation with patient symptoms. Electronically Signed   By: Ulyses SouthwardMark  Boles M.D.   On: 07/27/2017 17:19      Assessment: ASC- cannot rule out HSIL of the cervix Inadequate colposcopy with lesion extending into the endocervical canal   Patient Active Problem List   Diagnosis Date Noted  . Abnormal Pap smear of cervix 06/22/2017  . Thyroid nodule 06/16/2017  . Encounter for gynecological examination with Papanicolaou smear of  cervix 06/16/2017  . Unspecified hypothyroidism 03/10/2013    Plan: Laser conization of the cervix for diagnostic and therapeutic indications  Lazaro ArmsLuther H Cheynne Virden 08/12/2017 7:05 AM

## 2017-08-12 NOTE — Anesthesia Postprocedure Evaluation (Signed)
Anesthesia Post Note Late Entry for 0855  Patient: April Jordan  Procedure(s) Performed: LASER CONIZATION CERVIX WITH BIOPSY (N/A )  Patient location during evaluation: PACU Anesthesia Type: General Level of consciousness: awake and alert, oriented and patient cooperative Pain management: pain level controlled Vital Signs Assessment: post-procedure vital signs reviewed and stable Respiratory status: spontaneous breathing Cardiovascular status: stable Postop Assessment: no apparent nausea or vomiting Anesthetic complications: no     Last Vitals:  Vitals:   08/12/17 0900 08/12/17 0915  BP: 112/75 114/71  Pulse: (!) 59 68  Resp: 14 14  Temp:    SpO2: 100% 100%    Last Pain:  Vitals:   08/12/17 0915  TempSrc:   PainSc: 5                  Ariele Vidrio A

## 2017-08-13 ENCOUNTER — Encounter (HOSPITAL_COMMUNITY): Payer: Self-pay | Admitting: Obstetrics & Gynecology

## 2017-08-20 ENCOUNTER — Encounter: Payer: Self-pay | Admitting: Obstetrics & Gynecology

## 2017-08-20 ENCOUNTER — Ambulatory Visit (INDEPENDENT_AMBULATORY_CARE_PROVIDER_SITE_OTHER): Payer: 59 | Admitting: Obstetrics & Gynecology

## 2017-08-20 VITALS — BP 102/60 | HR 74 | Ht 62.0 in | Wt 148.0 lb

## 2017-08-20 DIAGNOSIS — Z9889 Other specified postprocedural states: Secondary | ICD-10-CM

## 2017-08-20 NOTE — Progress Notes (Signed)
  HPI: Patient returns for routine postoperative follow-up having undergone laser conization on 08/19/2017.  The patient's immediate postoperative recovery has been unremarkable. Since hospital discharge the patient reports some bleeding and discharge minimal pain.   Current Outpatient Medications: levothyroxine (SYNTHROID, LEVOTHROID) 137 MCG tablet, Take 137 mcg by mouth daily before breakfast. , Disp: , Rfl:  loratadine (CLARITIN) 10 MG tablet, Take 10 mg by mouth daily., Disp: , Rfl:  acetaminophen (TYLENOL) 500 MG tablet, Take 1,000 mg by mouth every 6 (six) hours as needed for moderate pain or headache., Disp: , Rfl:  aspirin-acetaminophen-caffeine (EXCEDRIN MIGRAINE) 250-250-65 MG per tablet, Take 2 tablets by mouth every 6 (six) hours as needed for headache., Disp: , Rfl:  cyclobenzaprine (FLEXERIL) 10 MG tablet, Take 10 mg by mouth at bedtime as needed for muscle spasms., Disp: , Rfl:  esomeprazole (NEXIUM) 20 MG capsule, Take 20 mg by mouth daily before breakfast., Disp: , Rfl:  fluticasone (FLONASE) 50 MCG/ACT nasal spray, Place 1 spray into both nostrils daily. , Disp: , Rfl:  HYDROcodone-acetaminophen (NORCO/VICODIN) 5-325 MG tablet, Take 1 tablet by mouth every 6 (six) hours as needed. (Patient not taking: Reported on 08/20/2017), Disp: 15 tablet, Rfl: 0 hydrocortisone 2.5 % cream, Apply 1 application topically daily., Disp: , Rfl:  ketorolac (TORADOL) 10 MG tablet, Take 1 tablet (10 mg total) by mouth every 8 (eight) hours as needed. (Patient not taking: Reported on 08/20/2017), Disp: 15 tablet, Rfl: 0 ondansetron (ZOFRAN ODT) 8 MG disintegrating tablet, Take 1 tablet (8 mg total) by mouth every 8 (eight) hours as needed for nausea or vomiting. (Patient not taking: Reported on 08/20/2017), Disp: 20 tablet, Rfl: 0  No current facility-administered medications for this visit.     Blood pressure 102/60, pulse 74, height 5\' 2"  (1.575 m), weight 148 lb (67.1 kg).  Physical  Exam: vagian minimal blood Cervical cone bed is healing appropriately at 1 week  Diagnostic Tests:   Pathology: LSIL with negative margins  Impression: S/p laser conization of the cervix  Plan:   Follow up: 4  weeks  Lazaro ArmsLuther H Eure, MD

## 2017-09-03 DIAGNOSIS — E039 Hypothyroidism, unspecified: Secondary | ICD-10-CM | POA: Diagnosis not present

## 2017-09-07 DIAGNOSIS — L308 Other specified dermatitis: Secondary | ICD-10-CM | POA: Diagnosis not present

## 2017-09-07 DIAGNOSIS — L578 Other skin changes due to chronic exposure to nonionizing radiation: Secondary | ICD-10-CM | POA: Diagnosis not present

## 2017-09-15 ENCOUNTER — Encounter: Payer: Self-pay | Admitting: Obstetrics & Gynecology

## 2017-09-15 ENCOUNTER — Ambulatory Visit (INDEPENDENT_AMBULATORY_CARE_PROVIDER_SITE_OTHER): Payer: 59 | Admitting: Obstetrics & Gynecology

## 2017-09-15 VITALS — BP 118/82 | HR 77 | Ht 62.0 in | Wt 150.5 lb

## 2017-09-15 DIAGNOSIS — Z9889 Other specified postprocedural states: Secondary | ICD-10-CM

## 2017-09-15 NOTE — Progress Notes (Signed)
  HPI: Patient returns for routine postoperative follow-up having undergone laser conization  on 08/12/2017.  The patient's immediate postoperative recovery has been unremarkable. Since hospital discharge the patient reports no problems.   Current Outpatient Medications: acetaminophen (TYLENOL) 500 MG tablet, Take 1,000 mg by mouth every 6 (six) hours as needed for moderate pain or headache., Disp: , Rfl:  aspirin-acetaminophen-caffeine (EXCEDRIN MIGRAINE) 250-250-65 MG per tablet, Take 2 tablets by mouth every 6 (six) hours as needed for headache., Disp: , Rfl:  cyclobenzaprine (FLEXERIL) 10 MG tablet, Take 10 mg by mouth at bedtime as needed for muscle spasms., Disp: , Rfl:  fexofenadine (ALLEGRA) 180 MG tablet, Take 180 mg by mouth daily., Disp: , Rfl:  hydrocortisone 2.5 % cream, Apply 1 application topically daily., Disp: , Rfl:  levothyroxine (SYNTHROID, LEVOTHROID) 137 MCG tablet, Take 137 mcg by mouth daily before breakfast. , Disp: , Rfl:   No current facility-administered medications for this visit.     Blood pressure 118/82, pulse 77, height  (1.575 m), weight 150 lb 8 oz (68.3 kg).  Physical Exam: Normally healed cervical cone bed and left lateral vagina which was also involved  Diagnostic Tests:   Pathology: HSIL  Impression: S/p Laser conization and vaginal ablation of HSIL  Plan: Follow up Pap q6 months for 2 years  Follow up: 6  months  Lazaro Arms, MD

## 2017-12-15 ENCOUNTER — Other Ambulatory Visit: Payer: 59 | Admitting: Obstetrics & Gynecology

## 2018-03-22 ENCOUNTER — Ambulatory Visit (INDEPENDENT_AMBULATORY_CARE_PROVIDER_SITE_OTHER): Payer: 59 | Admitting: Adult Health

## 2018-03-22 ENCOUNTER — Encounter: Payer: Self-pay | Admitting: Adult Health

## 2018-03-22 ENCOUNTER — Other Ambulatory Visit (HOSPITAL_COMMUNITY)
Admission: RE | Admit: 2018-03-22 | Discharge: 2018-03-22 | Disposition: A | Discharge status: Home / Self Care | Payer: 59 | Source: Ambulatory Visit | Attending: Adult Health | Admitting: Adult Health

## 2018-03-22 VITALS — BP 130/87 | HR 76 | Ht 62.0 in | Wt 146.0 lb

## 2018-03-22 DIAGNOSIS — N941 Unspecified dyspareunia: Secondary | ICD-10-CM | POA: Insufficient documentation

## 2018-03-22 DIAGNOSIS — Z9889 Other specified postprocedural states: Secondary | ICD-10-CM | POA: Diagnosis not present

## 2018-03-22 DIAGNOSIS — Z8742 Personal history of other diseases of the female genital tract: Secondary | ICD-10-CM

## 2018-03-22 DIAGNOSIS — R232 Flushing: Secondary | ICD-10-CM | POA: Diagnosis not present

## 2018-03-22 DIAGNOSIS — Z01419 Encounter for gynecological examination (general) (routine) without abnormal findings: Secondary | ICD-10-CM

## 2018-03-22 NOTE — Progress Notes (Signed)
  Subjective:     Patient ID: April Jordan, female   DOB: 04/13/1971, 47 y.o.   MRN: [id number]  HPI April Jordan is a 47 year old white female in for first pap after Laser surgery on cervix 08/12/17, [facility].  PCP is Dr Dimas AguasHoward.   Review of Systems +pain with sex since laser cone +hot flashes Reviewed past medical,surgical, social and family history. Reviewed medications and allergies.     Objective:   Physical Exam BP 130/87 (BP Location: Right Arm, Patient Position: Sitting, Cuff Size: Normal)   Pulse 76   Ht 5\' 2"  (1.575 m)   Wt 146 lb (66.2 kg)   BMI 26.70 kg/m    Skin warm and dry.Pelvic: external genitalia is normal in appearance no lesions, vagina:pale pink, with some loss of moisture and ruge,urethra has no lesions or masses noted, cervix:is sp laser, pap with HPV performed, uterus: normal size, shape and contour, non tender, no masses felt, adnexa: no masses or tenderness noted. Bladder is non tender and no masses felt. Pt said she as tender with speculum in place.  Discussed may could try some vaginal estrogen after pap is back, try pillow under helps and good lubricate on self and partner.  Declines oral HRT but may be open to vaginal estrogen, will talk when pap back. Examination chaperoned by Malachy MoodJanet Young LPN.  Fall risk low.  Assessment:     1. Encounter for gynecological examination with Papanicolaou smear of cervix   2. History of abnormal cervical Pap smear   3. S/P laser of cervix   4. Dyspareunia in female   5. Hot flashes       Plan:     Pap with HPV sent Return in 6 months for pap and physical  Will do pap every 6 months for 2 years

## 2018-03-29 ENCOUNTER — Telehealth: Payer: Self-pay | Admitting: Adult Health

## 2018-03-29 LAB — CYTOLOGY - PAP: HPV (WINDOPATH): NOT DETECTED

## 2018-03-29 NOTE — Telephone Encounter (Signed)
Left message to call me about pap 

## 2018-03-29 NOTE — Telephone Encounter (Signed)
Pt aware of pap and need for colpo, appt made with Dr Despina HiddenEure

## 2018-04-01 ENCOUNTER — Ambulatory Visit (INDEPENDENT_AMBULATORY_CARE_PROVIDER_SITE_OTHER): Payer: 59 | Admitting: Obstetrics & Gynecology

## 2018-04-01 ENCOUNTER — Other Ambulatory Visit: Payer: Self-pay

## 2018-04-01 ENCOUNTER — Encounter: Payer: Self-pay | Admitting: Obstetrics & Gynecology

## 2018-04-01 VITALS — BP 122/86 | HR 60 | Ht 62.0 in | Wt 147.0 lb

## 2018-04-01 DIAGNOSIS — R87611 Atypical squamous cells cannot exclude high grade squamous intraepithelial lesion on cytologic smear of cervix (ASC-H): Secondary | ICD-10-CM | POA: Diagnosis not present

## 2018-04-01 DIAGNOSIS — Z3202 Encounter for pregnancy test, result negative: Secondary | ICD-10-CM | POA: Diagnosis not present

## 2018-04-01 DIAGNOSIS — Z9889 Other specified postprocedural states: Secondary | ICD-10-CM

## 2018-04-01 LAB — POCT URINE PREGNANCY: Preg Test, Ur: NEGATIVE

## 2018-04-01 MED ORDER — ESTROGENS, CONJUGATED 0.625 MG/GM VA CREA: TOPICAL_CREAM | VAGINAL | 12 refills | Status: DC

## 2018-04-01 NOTE — Progress Notes (Signed)
Colposcopy Procedure Note:  Colposcopy Procedure Note  Indications: Pap smear 1 months ago showed: ASC cannot exclude high grade lesion [facility]). The prior pap showed ASC cannot exclude high grade lesion [facility]).  Prior cervical/vaginal disease: lesion extending into endocervical canal. Prior cervical treatment: laser conization of the cervix with negative margins.  Smoker:  No. New sexual partner:    : time frame:    History of abnormal Pap: yes  Procedure Details  The risks and benefits of the procedure and Written informed consent obtained.  Speculum placed in vagina and excellent visualization of cervix achieved, cervix swabbed x 3 with acetic acid solution.  Findings: Cervix: cervix is s/p laser cone and esentially flush with the cervix; no biopsies taken. Vaginal inspection: normal without visible lesions. Vulvar colposcopy: vulvar colposcopy not performed.  Specimens: none  Complications: .  Plan: Pt with flush cervix s/p laser conization Also has pain with intercourse post op Does have atrophic changes as well  With a repeat finding of ASC H s/p laser conization with negative margins, still inadequate colposcopy will need definitive therapy Additionally has dyspareunia s/p conization  No cervix to grasp so vaginal surgery is not possible Discussed with pt and her husband and thy opt for TAH BSO thru a mini lap incisionon  Scheduled for 05/12/2018

## 2018-04-02 DIAGNOSIS — Z6826 Body mass index (BMI) 26.0-26.9, adult: Secondary | ICD-10-CM | POA: Diagnosis not present

## 2018-04-02 DIAGNOSIS — L309 Dermatitis, unspecified: Secondary | ICD-10-CM | POA: Diagnosis not present

## 2018-04-07 ENCOUNTER — Telehealth: Payer: Self-pay | Admitting: Obstetrics & Gynecology

## 2018-04-07 DIAGNOSIS — Z029 Encounter for administrative examinations, unspecified: Secondary | ICD-10-CM

## 2018-04-07 NOTE — Telephone Encounter (Signed)
Needs to talk to a nurse or Eure about changing meds because of cost

## 2018-04-07 NOTE — Telephone Encounter (Signed)
Pt called stating that the premarin vaginal cream was too expensive. She states that she was given a discount card and the information required peeled off along with the sticker so she was unable to activate it. Ad[facility] could [facility] [phone] for a card sent to her cell phone. Advised that if that did not work I would leave another discount card at the front desk for her to pick up at her convenience. Pt verbalized understanding.

## 2018-04-12 ENCOUNTER — Telehealth: Payer: Self-pay | Admitting: *Deleted

## 2018-04-12 NOTE — Telephone Encounter (Signed)
Pt returning your call from Friday. °

## 2018-04-12 NOTE — Telephone Encounter (Signed)
Spoke to patient regarding clarification on FMLA leave. Will start 05/12/18.

## 2018-05-03 NOTE — Patient Instructions (Signed)
April Jordan  05/03/2018     @[email or web address] @   Your procedure is scheduled on  05/12/2018  Report to [facility] at  800  A.M.  Call this number if you have problems the morning of surgery:  [phone]   Remember:  Do not eat or drink after midnight.                         Take these medicines the morning of surgery with A SIP OF WATER Xanax, nexium, levothyroxine.    Do not wear jewelry, make-up or nail polish.  Do not wear lotions, powders, or perfumes, or deodorant.  Do not shave 48 hours prior to surgery.  Men may shave face and neck.  Do not bring valuables to the hospital.  [facility] is not responsible for any belongings or valuables.  Contacts, dentures or bridgework may not be worn into surgery.  Leave your suitcase in the car.  After surgery it may be brought to your room.  For patients admitted to the hospital, discharge time will be determined by your treatment team.  Patients discharged the day of surgery will not be allowed to drive home.   Name and phone number of your driver:   family Special instructions:   None  Please read over the following fact sheets that you were given. Pain Booklet, Coughing and Deep Breathing, Blood Transfusion Information, Lab Information, MRSA Information, Surgical Site Infection Prevention, Anesthesia Post-op Instructions and Care and Recovery After Surgery       Bilateral Salpingo-Oophorectomy  Bilateral salpingo-oophorectomy is the surgical removal of both fallopian tubes and both ovaries. The ovaries are reproductive organs that produce eggs in women. The fallopian tubes allow eggs to move from the ovaries to the uterus. You may need this procedure if you:  Have had your uterus removed. This procedure is usually done after the uterus is removed.  Have cancer of the fallopian tubes or ovaries.  Have a high risk of cancer of the fallopian tubes or ovaries. There are three different  techniques that can be used for this procedure:  Open. One large incision will be made in your abdomen.  Laparoscopic. A thin, lighted tube with a small camera on the end (laparoscope) will be used to help perform the procedure. The laparoscope will allow your surgeon to make several small incisions in the abdomen instead of one large incision.  Robot-assisted. A computer will be used to control surgical instruments that are attached to robotic arms. A laparoscope may also be used with this technique. As a result of this procedure, you will become sterile (unable to become pregnant), and you will go into menopause (no longer able to have menstrual periods). You may develop symptoms of menopause such as hot flashes, night sweats, and mood changes. Your sex drive may also be affected. Tell a health care provider about:  Any allergies you have.  All medicines you are taking, including vitamins, herbs, eye drops, creams, and over-the-counter medicines.  Any problems you or family members have had with anesthetic medicines.  Any blood disorders you have.  Any surgeries you have had.  Any medical conditions you have.  Whether you are pregnant or may be pregnant. What are the risks? Generally, this is a safe procedure. However, problems may occur, including:  Infection.  Bleeding.  Allergic reactions to medicines.  Damage to other structures  or organs.  Blood clots in the legs or lungs. What happens before the procedure? Staying hydrated Follow instructions from your health care provider about hydration, which may include:  Up to 2 hours before the procedure - you may continue to drink clear liquids, such as water, clear fruit juice, black coffee, and plain tea. Eating and drinking restrictions Follow instructions from your health care provider about eating and drinking, which may include:  8 hours before the procedure - stop eating heavy meals or foods such as meat, fried foods,  or fatty foods.  6 hours before the procedure - stop eating light meals or foods, such as toast or cereal.  6 hours before the procedure - stop drinking milk or drinks that contain milk.  2 hours before the procedure - stop drinking clear liquids. Medicines  Ask your health care provider about: ? Changing or stopping your regular medicines. This is especially important if you are taking diabetes medicines or blood thinners. ? Taking medicines such as aspirin and ibuprofen. These medicines can thin your blood. Do not take these medicines before your procedure if your health care provider instructs you not to.  You may be given antibiotic medicine to help prevent infection. General instructions  Do not smoke for at least 2 weeks before your procedure or as told by your health care provider.  You may have an exam or testing.  You may have a blood or urine sample taken.  Ask your health care provider how your surgical site will be marked or identified.  Plan to have someone take you home from the hospital.  If you will be going home right after the procedure, plan to have someone with you for 24 hours. What happens during the procedure?  To reduce your risk of infection: ? Your health care team will wash or sanitize their hands. ? Your skin will be washed with soap. ? Hair may be removed from the surgical area.  An IV tube will be inserted into one of your veins.  You will be given one or more of the following: ? A medicine to help you relax (sedative). ? A medicine to make you fall asleep (general anesthetic).  A thin tube (catheter) will be inserted through your urethra and into your bladder. The catheter drains urine during your procedure.  Depending on the type of surgery you are having, your surgeon will do one of the following: ? Make one incision in your abdomen (open surgery). ? Make two small incisions in your abdomen (laparoscopic surgery). The laparoscope will be  passed through one incision, and surgical instruments will be passed through the other. ? Make several small incisions in your abdomen (robot-assisted surgery). A laparoscope and other surgical instruments may be passed through the incisions.  Your fallopian tubes and ovaries will be cut away from the uterus and removed.  Your blood vessels will be clamped and tied to prevent too much bleeding.  The incision(s) in your abdomen will be closed with stitches (sutures) or staples.  A bandage (dressing) may be placed over your incision(s). The procedure may vary among health care providers and hospitals. What happens after the procedure?  Your blood pressure, heart rate, breathing rate, and blood oxygen level will be monitored until the medicines you were given have worn off.  You may continue to receive fluids and medicines through an IV tube.  You may continue to have a catheter draining your urine.  You may have to wear compression stockings.  These stockings help to prevent blood clots and reduce swelling in your legs.  You will be given pain medicine as needed.  Do not drive for 24 hours if you received a sedative. Summary  Bilateral salpingo-oophorectomy is a procedure to remove both fallopian tubes and both ovaries.  There are three different techniques that can be used for this procedure, including open, laparoscopic, and robotic. Talk with your health care provider about how your procedure will be done.  As a result of this procedure, you will become sterile and you will go into menopause.  Plan to have someone take you home from the hospital. This information is not intended to replace advice given to you by your health care provider. Make sure you discuss any questions you have with your health care provider. Document Released: 04/14/2005 Document Revised: 07/24/2016 Document Reviewed: 05/19/2016 Elsevier Interactive Patient Education  2019 [employer]  Bilateral  Salpingo-Oophorectomy, Care After This sheet gives you information about how to care for yourself after your procedure. Your health care provider may also give you more specific instructions. If you have problems or questions, contact your health care provider. What can I expect after the procedure? After the procedure, it is common to have:  Abdominal pain.  Some occasional vaginal bleeding (spotting).  Tiredness.  Symptoms of menopause, such as hot flashes, night sweats, or mood swings. Follow these instructions at home: Incision care   Keep your incision area and your bandage (dressing) clean and dry.  Follow instructions from your health care provider about how to take care of your incision. Make sure you: ? Wash your hands with soap and water before you change your dressing. If soap and water are not available, use hand sanitizer. ? Change your dressing as told by your health care provider. ? Leave stitches (sutures), staples, skin glue, or adhesive strips in place. These skin closures may need to stay in place for 2 weeks or longer. If adhesive strip edges start to loosen and curl up, you may trim the loose edges. Do not remove adhesive strips completely unless your health care provider tells you to do that.  Check your incision area every day for signs of infection. Check for: ? Redness, swelling, or pain. ? Fluid or blood. ? Warmth. ? Pus or a bad smell. Activity  Do not drive or use heavy machinery while taking prescription pain medicine.  Do not drive for 24 hours if you received a medicine to help you relax (sedative) during your procedure.  Take frequent, short walks throughout the day. Rest when you get tired. Ask your health care provider what activities are safe for you.  Avoid activity that requires great effort. Also, avoid heavy lifting. Do not lift anything that is heavier than 10 lbs. (4.5 kg), or the limit that your health care provider tells you, until he or  she says that it is safe to do so.  Do not douche, use tampons, or have sex until your health care provider approves. General instructions  To prevent or treat constipation while you are taking prescription pain medicine, your health care provider may recommend that you: ? Drink enough fluid to keep your urine clear or pale yellow. ? Take over-the-counter or prescription medicines. ? Eat foods that are high in fiber, such as fresh fruits and vegetables, whole grains, and beans. ? Limit foods that are high in fat and processed sugars, such as fried and sweet foods.  Take over-the-counter and prescription medicines only as told  by your health care provider.  Do not take baths, swim, or use a hot tub until your health care provider approves. Ask your health care provider if you can take showers. You may only be allowed to take sponge baths for bathing.  Wear compression stockings as told by your health care provider. These stockings help to prevent blood clots and reduce swelling in your legs.  Keep all follow-up visits as told by your health care provider. This is important. Contact a health care provider if:  You have pain when you urinate.  You have pus or a bad smelling discharge coming from your vagina.  You have redness, swelling, or pain around your incision.  You have fluid or blood coming from your incision.  Your incision feels warm to the touch.  You have pus or a bad smell coming from your incision.  You have a fever.  Your incision starts to break open.  You have pain in the abdomen, and it gets worse or does not get better when you take medicine.  You develop a rash.  You develop nausea and vomiting.  You feel lightheaded. Get help right away if:  You develop pain in your chest or leg.  You become short of breath.  You faint.  You have increased bleeding from your vagina. Summary  After the procedure, it is common to have pain, bleeding in the vagina,  and symptoms of menopause.  Follow instructions from your health care provider about how to take care of your incision.  Follow instructions from your health care provider about activities and restrictions.  Check your incision every day for signs of infection and report any symptoms to your health care provider. This information is not intended to replace advice given to you by your health care provider. Make sure you discuss any questions you have with your health care provider. Document Released: 04/14/2005 Document Revised: 07/24/2016 Document Reviewed: 05/19/2016 Elsevier Interactive Patient Education  2019 [employer]  Abdominal Hysterectomy Abdominal hysterectomy is a surgery to remove your womb (uterus). The womb is the part of your body that holds a growing baby. You may need this procedure if:  You have cancer.  You have growths (tumors or fibroids) in your uterus.  You have long-term (chronic) pain.  You are bleeding.  Your womb has slipped down into your vagina.  You have a condition in which the tissue that lines the womb grows outside of its normal place.  You have an infection in your womb.  You have problems with your period. You may also need other reproductive parts removed. This will depend on why you need to have the surgery. What happens before the procedure? Staying hydrated Follow instructions from your doctor about hydration. This may include:  Up to 2 hours before the procedure - you may continue to drink clear liquids, such as: ? Water. ? Fruit juice. ? Black coffee. ? Plain tea. Eating and drinking restrictions Follow instructions from your doctor about eating and drinking. These may include:  8 hours before the procedure - stop eating heavy meals or foods, such as: ? Meat. ? Fried foods. ? Fatty foods.  6 hours before the procedure - stop eating light meals or foods, such as: ? Toast. ? Cereal.  6 hours before the procedure - stop  drinking: ? Milk ? Drinks that have milk in them.  2 hours before the procedure - stop drinking clear liquids. Medicines  Ask your doctor about: ? Changing or  stopping your normal medicines. This is important if you take diabetes medicines or blood thinners. ? Taking medicines such as aspirin and ibuprofen. These medicines can thin your blood. Do not take these medicines before your procedure if your doctor tells you not to.  You may be given antibiotic medicine. This can help prevent infection.  You may be asked to take medicines that help you poop (laxatives). General instructions  Ask your doctor how your surgical site will be marked or identified.  You may be asked to shower with a germ-killing soap.  Plan to have someone take you home from the hospital.  Do not use any products that contain nicotine or tobacco, such as cigarettes and e-cigarettes. If you need help quitting, ask your doctor.  You may have an exam or tests done.  You may have a blood or urine sample taken.  You may need to have an enema to clean out your rectum and lower colon.  Talk to your doctor about the changes this procedure may cause. These can be physical or emotional. What happens during the procedure?  To lower your risk of infection: ? Your health care team will wash or clean their hands. ? Your skin will be washed with soap. ? Hair may be removed from the surgical area.  An IV tube will be put into one of your veins.  You will be given one or more of the following: ? A medicine to help you relax (sedative). ? A medicine to make you fall asleep (general anesthetic).  Tight-fitting (compression) stockings will be placed on your legs to help with circulation.  A thin, flexible tube (catheter) will be inserted to help drain your urine.  The doctor will make a cut (incision) through the skin in your lower belly. It may go side-to-side or up-and-down.  The doctor will move the body tissues  that cover your womb.  The doctor will remove your womb.  The doctor may take out any other parts that need to be removed.  The doctor will control the bleeding.  The doctor will close your cut with stitches (sutures), skin glue, or adhesive strips.  A bandage (dressing) will be placed over the cut. The procedure may vary among doctors and hospitals. What happens after the procedure?  You will be given pain medicine if you need it.  Your blood pressure, heart rate, breathing rate, and blood oxygen level will be watched until the medicines you were given have worn off.  You will need to stay in the hospital to recover. Ask your doctor how long you will need to stay in the hospital after your procedure.  You may have a liquid diet at first. You will most likely return to your usual diet the day after surgery.  You will still have the urinary catheter in place. It will likely be removed the day after surgery.  You may have to wear compression stockings. These stockings help to prevent blood clots and reduce swelling in your legs.  You will be encouraged to walk as soon as possible. You will also use a device or do breathing exercises to keep your lungs clear.  You may need to use a sanitary pad for vaginal discharge. Summary  Abdominal hysterectomy is a surgery to remove your womb (uterus). The womb is the part of your body that holds a growing baby.  Talk to your doctor about the changes this procedure may cause. These can be physical or emotional.  You will  be given pain medicine if you need it.  You will need to stay in the hospital to recover for one to two days. Ask your doctor how long you will need to stay in the hospital after your procedure. This information is not intended to replace advice given to you by your health care provider. Make sure you discuss any questions you have with your health care provider. Document Released: 04/19/2013 Document Revised: 04/02/2016  Document Reviewed: 04/02/2016 Elsevier Interactive Patient Education  2019 [employer]  Abdominal Hysterectomy, Care After This sheet gives you information about how to care for yourself after your procedure. Your doctor may also give you more specific instructions. If you have problems or questions, contact your doctor. Follow these instructions at home: Bathing  Do not take baths, swim, or use a hot tub until your doctor says it is okay. Ask your doctor if you can take showers. You may only be allowed to take sponge baths for bathing.  Keep the bandage (dressing) dry until your doctor says it can be taken off. Surgical cut (incision) care      Follow instructions from your doctor about how to take care of your cut from surgery. Make sure you: ? Wash your hands with soap and water before you change your bandage (dressing). If you cannot use soap and water, use hand sanitizer. ? Change your bandage as told by your doctor. ? Leave stitches (sutures), skin glue, or skin tape (adhesive) strips in place. They may need to stay in place for 2 weeks or longer. If tape strips get loose and curl up, you may trim the loose edges. Do not remove tape strips completely unless your doctor says it is okay.  Check your surgical cut area every day for signs of infection. Check for: ? Redness, swelling, or pain. ? Fluid or blood. ? Warmth. ? Pus or a bad smell. Activity  Do gentle, daily exercise as told by your doctor. You may be told to take short walks every day and go farther each time.  Do not lift anything that is heavier than 10 lb (4.5 kg), or the limit that your doctor tells you, until he or she says that it is safe.  Do not drive or use heavy machinery while taking prescription pain medicine.  Do not drive for 24 hours if you were given a medicine to help you relax (sedative).  Follow your doctor's advice about exercise, driving, and general activities. Ask your doctor what activities  are safe for you. Lifestyle   Do not douche, use tampons, or have sex for at least 6 weeks or as told by your doctor.  Do not drink alcohol until your doctor says it is okay.  Drink enough fluid to keep your pee (urine) clear or pale yellow.  Try to have someone at home with you for the first 1-2 weeks to help.  Do not use any products that contain nicotine or tobacco, such as cigarettes and e-cigarettes. These can slow down healing. If you need help quitting, ask your doctor. General instructions  Take over-the-counter and prescription medicines only as told by your doctor.  Do not take aspirin or ibuprofen. These medicines can cause bleeding.  To prevent or treat constipation while you are taking prescription pain medicine, your doctor may suggest that you: ? Drink enough fluid to keep your urine clear or pale yellow. ? Take over-the-counter or prescription medicines. ? Eat foods that are high in fiber, such as:  Fresh  fruits and vegetables.  Whole grains.  Beans. ? Limit foods that are high in fat and processed sugars, such as fried and sweet foods.  Keep all follow-up visits as told by your doctor. This is important. Contact a doctor if:  You have chills or fever.  You have redness, swelling, or pain around your cut.  You have fluid or blood coming from your cut.  Your cut feels warm to the touch.  You have pus or a bad smell coming from your cut.  Your cut breaks open.  You feel dizzy or light-headed.  You have pain or bleeding when you pee.  You keep having watery poop (diarrhea).  You keep feeling sick to your stomach (nauseous) or keep throwing up (vomiting).  You have unusual fluid (discharge) coming from your vagina.  You have a rash.  You have a reaction to your medicine.  Your pain medicine does not help. Get help right away if:  You have a fever and your symptoms get worse all of a sudden.  You have very bad belly (abdominal) pain.  You  are short of breath.  You pass out (faint).  You have pain, swelling, or redness of your leg.  You bleed a lot from your vagina and notice clumps of blood (clots). Summary  Do not take baths, swim, or use a hot tub until your doctor says it is okay. Ask your doctor if you can take showers. You may only be allowed to take sponge baths for bathing.  Follow your doctor's advice about exercise, driving, and general activities. Ask your doctor what activities are safe for you.  Do not lift anything that is heavier than 10 lb (4.5 kg), or the limit that your doctor tells you, until he or she says that it is safe.  Try to have someone at home with you for the first 1-2 weeks to help. This information is not intended to replace advice given to you by your health care provider. Make sure you discuss any questions you have with your health care provider. Document Released: 01/22/2008 Document Revised: 04/02/2016 Document Reviewed: 04/02/2016 Elsevier Interactive Patient Education  2019 [employer]  General Anesthesia, Adult General anesthesia is the use of medicines to make a person "go to sleep" (unconscious) for a medical procedure. General anesthesia must be used for certain procedures, and is often recommended for procedures that:  Last a long time.  Require you to be still or in an unusual position.  Are major and can cause blood loss. The medicines used for general anesthesia are called general anesthetics. As well as making you unconscious for a certain amount of time, these medicines:  Prevent pain.  Control your blood pressure.  Relax your muscles. Tell a health care provider about:  Any allergies you have.  All medicines you are taking, including vitamins, herbs, eye drops, creams, and over-the-counter medicines.  Any problems you or family members have had with anesthetic medicines.  Types of anesthetics you have had in the past.  Any blood disorders you have.  Any  surgeries you have had.  Any medical conditions you have.  Any recent upper respiratory, chest, or ear infections.  Any history of: ? Heart or lung conditions, such as heart failure, sleep apnea, asthma, or chronic obstructive pulmonary disease (COPD). ? Financial planner. ? Depression or anxiety.  Any tobacco or drug use, including marijuana or alcohol use.  Whether you are pregnant or may be pregnant. What are the risks? Generally,  this is a safe procedure. However, problems may occur, including:  Allergic reaction.  Lung and heart problems.  Inhaling food or liquid from the stomach into the lungs (aspiration).  Nerve injury.  Dental injury.  Air in the bloodstream, which can lead to stroke.  Extreme agitation or confusion (delirium) when you wake up from the anesthetic.  Waking up during your procedure and being unable to move. This is rare. These problems are more likely to develop if you are having a major surgery or if you have an advanced or serious medical condition. You can prevent some of these complications by answering all of your health care provider's questions thoroughly and by following all instructions before your procedure. General anesthesia can cause side effects, including:  Nausea or vomiting.  A sore throat from the breathing tube.  Hoarseness.  Wheezing or coughing.  Shaking chills.  Tiredness.  Body aches.  Anxiety.  Sleepiness or drowsiness.  Confusion or agitation. What happens before the procedure? Staying hydrated Follow instructions from your health care provider about hydration, which may include:  Up to 2 hours before the procedure - you may continue to drink clear liquids, such as water, clear fruit juice, black coffee, and plain tea.  Eating and drinking restrictions Follow instructions from your health care provider about eating and drinking, which may include:  8 hours before the procedure - stop eating heavy meals or  foods such as meat, fried foods, or fatty foods.  6 hours before the procedure - stop eating light meals or foods, such as toast or cereal.  6 hours before the procedure - stop drinking milk or drinks that contain milk.  2 hours before the procedure - stop drinking clear liquids. Medicines Ask your health care provider about:  Changing or stopping your regular medicines. This is especially important if you are taking diabetes medicines or blood thinners.  Taking medicines such as aspirin and ibuprofen. These medicines can thin your blood. Do not take these medicines unless your health care provider tells you to take them.  Taking over-the-counter medicines, vitamins, herbs, and supplements. Do not take these during the week before your procedure unless your health care provider approves them. General instructions  Starting 3-6 weeks before the procedure, do not use any products that contain nicotine or tobacco, such as cigarettes and e-cigarettes. If you need help quitting, ask your health care provider.  If you brush your teeth on the morning of the procedure, make sure to spit out all of the toothpaste.  Tell your health care provider if you become ill or develop a cold, cough, or fever.  If instructed by your health care provider, bring your sleep apnea device with you on the day of your surgery (if applicable).  Ask your health care provider if you will be going home the same day, the following day, or after a longer hospital stay. ? Plan to have someone take you home from the hospital or clinic. ? Plan to have a responsible adult care for you for at least 24 hours after you leave the hospital or clinic. This is important. What happens during the procedure?   You will be given anesthetics through both of the following: ? A mask placed over your nose and mouth. ? An IV in one of your veins.  You may receive a medicine to help you relax (sedative).  After you are unconscious, a  breathing tube may be inserted down your throat to help you breathe. This will  be removed before you wake up.  An anesthesia specialist will stay with you throughout your procedure. He or she will: ? Keep you comfortable and safe by continuing to give you medicines and adjusting the amount of medicine that you get. ? Monitor your blood pressure, pulse, and oxygen levels to make sure that the anesthetics do not cause any problems. The procedure may vary among health care providers and hospitals. What happens after the procedure?  Your blood pressure, temperature, heart rate, breathing rate, and blood oxygen level will be monitored until the medicines you were given have worn off.  You will wake up in a recovery area. You may wake up slowly.  If you feel anxious or agitated, you may be given medicine to help you calm down.  If you will be going home the same day, your health care provider may check to make sure you can walk, drink, and urinate.  Your health care provider will treat any pain or side effects you have before you go home.  Do not drive for 24 hours if you were given a sedative. Summary  General anesthesia is used to keep you still and prevent pain during a procedure.  It is important to tell your health care provider about your medical history and any surgeries you have had, and previous experience with anesthesia.  Follow your health care provider's instructions about when to stop eating, drinking, or taking certain medicines before your procedure.  Plan to have someone take you home from the hospital or clinic. This information is not intended to replace advice given to you by your health care provider. Make sure you discuss any questions you have with your health care provider. Document Released: 07/22/2007 Document Revised: 09/01/2017 Document Reviewed: 11/28/2016 Elsevier Interactive Patient Education  2019 Elsevier Inc. General Anesthesia, Adult, Care After This  sheet gives you information about how to care for yourself after your procedure. Your health care provider may also give you more specific instructions. If you have problems or questions, contact your health care provider. What can I expect after the procedure? After the procedure, the following side effects are common:  Pain or discomfort at the IV site.  Nausea.  Vomiting.  Sore throat.  Trouble concentrating.  Feeling cold or chills.  Weak or tired.  Sleepiness and fatigue.  Soreness and body aches. These side effects can affect parts of the body that were not involved in surgery. Follow these instructions at home:  For at least 24 hours after the procedure:  Have a responsible adult stay with you. It is important to have someone help care for you until you are awake and alert.  Rest as needed.  Do not: ? Participate in activities in which you could fall or become injured. ? Drive. ? Use heavy machinery. ? Drink alcohol. ? Take sleeping pills or medicines that cause drowsiness. ? Make important decisions or sign legal documents. ? Take care of children on your own. Eating and drinking  Follow any instructions from your health care provider about eating or drinking restrictions.  When you feel hungry, start by eating small amounts of foods that are soft and easy to digest (bland), such as toast. Gradually return to your regular diet.  Drink enough fluid to keep your urine pale yellow.  If you vomit, rehydrate by drinking water, juice, or clear broth. General instructions  If you have sleep apnea, surgery and certain medicines can increase your risk for breathing problems. Follow instructions from your  health care provider about wearing your sleep device: ? Anytime you are sleeping, including during daytime naps. ? While taking prescription pain medicines, sleeping medicines, or medicines that make you drowsy.  Return to your normal activities as told by your  health care provider. Ask your health care provider what activities are safe for you.  Take over-the-counter and prescription medicines only as told by your health care provider.  If you smoke, do not smoke without supervision.  Keep all follow-up visits as told by your health care provider. This is important. Contact a health care provider if:  You have nausea or vomiting that does not get better with medicine.  You cannot eat or drink without vomiting.  You have pain that does not get better with medicine.  You are unable to pass urine.  You develop a skin rash.  You have a fever.  You have redness around your IV site that gets worse. Get help right away if:  You have difficulty breathing.  You have chest pain.  You have blood in your urine or stool, or you vomit blood. Summary  After the procedure, it is common to have a sore throat or nausea. It is also common to feel tired.  Have a responsible adult stay with you for the first 24 hours after general anesthesia. It is important to have someone help care for you until you are awake and alert.  When you feel hungry, start by eating small amounts of foods that are soft and easy to digest (bland), such as toast. Gradually return to your regular diet.  Drink enough fluid to keep your urine pale yellow.  Return to your normal activities as told by your health care provider. Ask your health care provider what activities are safe for you. This information is not intended to replace advice given to you by your health care provider. Make sure you discuss any questions you have with your health care provider. Document Released: 07/21/2000 Document Revised: 11/28/2016 Document Reviewed: 11/28/2016 Elsevier Interactive Patient Education  2019 [employer]

## 2018-05-07 ENCOUNTER — Other Ambulatory Visit: Payer: Self-pay | Admitting: Obstetrics & Gynecology

## 2018-05-10 ENCOUNTER — Encounter (HOSPITAL_COMMUNITY)
Admission: RE | Admit: 2018-05-10 | Discharge: 2018-05-10 | Disposition: A | Discharge status: Home / Self Care | Payer: No Typology Code available for payment source | Source: Ambulatory Visit | Attending: Obstetrics & Gynecology | Admitting: Obstetrics & Gynecology

## 2018-05-10 ENCOUNTER — Encounter (HOSPITAL_COMMUNITY): Payer: Self-pay

## 2018-05-10 ENCOUNTER — Other Ambulatory Visit: Payer: Self-pay

## 2018-05-10 ENCOUNTER — Other Ambulatory Visit: Payer: Self-pay | Admitting: *Deleted

## 2018-05-10 DIAGNOSIS — Z01812 Encounter for preprocedural laboratory examination: Secondary | ICD-10-CM

## 2018-05-10 LAB — URINALYSIS, ROUTINE W REFLEX MICROSCOPIC
Bilirubin Urine: NEGATIVE
Glucose, UA: NEGATIVE mg/dL
Hgb urine dipstick: NEGATIVE
Ketones, ur: NEGATIVE mg/dL
Nitrite: NEGATIVE
Protein, ur: NEGATIVE mg/dL
SPECIFIC GRAVITY, URINE: 1.002 — AB (ref 1.005–1.030)
pH: 8 (ref 5.0–8.0)

## 2018-05-10 LAB — COMPREHENSIVE METABOLIC PANEL
ALT: 39 U/L (ref 0–44)
AST: 54 U/L — ABNORMAL HIGH (ref 15–41)
Albumin: 4 g/dL (ref 3.5–5.0)
Alkaline Phosphatase: 52 U/L (ref 38–126)
Anion gap: 6 (ref 5–15)
BUN: 15 mg/dL (ref 6–20)
CO2: 26 mmol/L (ref 22–32)
Calcium: 8.9 mg/dL (ref 8.9–10.3)
Chloride: 103 mmol/L (ref 98–111)
Creatinine, Ser: 0.86 mg/dL (ref 0.44–1.00)
GFR calc Af Amer: >60 mL/min (ref >60)
GFR calc non Af Amer: >60 mL/min (ref >60)
Glucose, Bld: 88 mg/dL (ref 70–99)
POTASSIUM: 4.2 mmol/L (ref 3.5–5.1)
Sodium: 135 mmol/L (ref 135–145)
Total Bilirubin: 0.7 mg/dL (ref 0.3–1.2)
Total Protein: 7.5 g/dL (ref 6.5–8.1)

## 2018-05-10 LAB — TYPE AND SCREEN
ABO/RH(D): AB POS
ANTIBODY SCREEN: NEGATIVE

## 2018-05-10 LAB — RAPID HIV SCREEN (HIV 1/2 AB+AG)
HIV 1/2 Antibodies: NONREACTIVE
HIV-1 P24 Antigen - HIV24: NONREACTIVE

## 2018-05-10 LAB — CBC
HCT: 41 % (ref 36.0–46.0)
HEMOGLOBIN: 12.8 g/dL (ref 12.0–15.0)
MCH: 29.9 pg (ref 26.0–34.0)
MCHC: 31.2 g/dL (ref 30.0–36.0)
MCV: 95.8 fL (ref 80.0–100.0)
Platelets: 398 10*3/uL (ref 150–400)
RBC: 4.28 MIL/uL (ref 3.87–5.11)
RDW: 12.8 % (ref 11.5–15.5)
WBC: 8.4 10*3/uL (ref 4.0–10.5)
nRBC: 0 % (ref 0.0–0.2)

## 2018-05-10 LAB — HCG, QUANTITATIVE, PREGNANCY: hCG, Beta Chain, Quant, S: <1 m[IU]/mL (ref <5)

## 2018-05-10 NOTE — Patient Outreach (Addendum)
[facility]) Care Management     05/10/2018  ROQAYA KENNEMORE 1971-02-13 [id number]  Preoperative assessment Date pre-op referral received: 05/10/18 Insurance: [employer]- Focus Plan   Subjective:  Telephone call to patient. Discussed purpose of pre-op call. Patient voices understanding and agrees to pre-op call.  She states she has completed her medical leave paperwork. She says she has the hospital indemnity benefit and is aware she will have to file the claim after her surgery. She says she will have 24/7 care at home to assist in her recovery. She does not have and is not interested in completing advanced directive a this time. She agrees to a post hospital discharge transition of care call.    Objective:  Per chart review, patient with atypical squamous cells cannot exclude high grade squamous intraepithelial lesion on cytologic smear of cervix;  scheduled for abdominal hysterectomy and salpingoophorectomy on 05/12/18 at [facility].   Assessment: Preoperative call completed, no preoperative needs identified.   Plan: RNCM will call patient for transition of care outreach within 72 hours of hospital discharge notification.   Bary Richard RN,CCM,CDE [facility] Care Management Coordinator Office Phone [phone] Office Fax [phone]

## 2018-05-12 ENCOUNTER — Encounter (HOSPITAL_COMMUNITY): Payer: Self-pay | Admitting: Anesthesiology

## 2018-05-12 ENCOUNTER — Inpatient Hospital Stay (HOSPITAL_COMMUNITY): Payer: No Typology Code available for payment source | Admitting: Anesthesiology

## 2018-05-12 ENCOUNTER — Inpatient Hospital Stay (HOSPITAL_COMMUNITY)
Admission: RE | Admit: 2018-05-12 | Discharge: 2018-05-13 | DRG: 743 | Disposition: A | Discharge status: Home / Self Care | Payer: No Typology Code available for payment source | Attending: Obstetrics & Gynecology | Admitting: Obstetrics & Gynecology

## 2018-05-12 ENCOUNTER — Other Ambulatory Visit: Payer: Self-pay

## 2018-05-12 ENCOUNTER — Encounter (HOSPITAL_COMMUNITY)
Admission: RE | Disposition: A | Discharge status: Home / Self Care | Payer: Self-pay | Source: Home / Self Care | Attending: Obstetrics & Gynecology

## 2018-05-12 DIAGNOSIS — N941 Unspecified dyspareunia: Secondary | ICD-10-CM | POA: Diagnosis present

## 2018-05-12 DIAGNOSIS — Z881 Allergy status to other antibiotic agents status: Secondary | ICD-10-CM

## 2018-05-12 DIAGNOSIS — Z9079 Acquired absence of other genital organ(s): Secondary | ICD-10-CM

## 2018-05-12 DIAGNOSIS — Z90722 Acquired absence of ovaries, bilateral: Secondary | ICD-10-CM

## 2018-05-12 DIAGNOSIS — Z9889 Other specified postprocedural states: Secondary | ICD-10-CM | POA: Diagnosis not present

## 2018-05-12 DIAGNOSIS — Z9049 Acquired absence of other specified parts of digestive tract: Secondary | ICD-10-CM | POA: Diagnosis not present

## 2018-05-12 DIAGNOSIS — I341 Nonrheumatic mitral (valve) prolapse: Secondary | ICD-10-CM | POA: Diagnosis present

## 2018-05-12 DIAGNOSIS — Z882 Allergy status to sulfonamides status: Secondary | ICD-10-CM | POA: Diagnosis not present

## 2018-05-12 DIAGNOSIS — Z888 Allergy status to other drugs, medicaments and biological substances status: Secondary | ICD-10-CM | POA: Diagnosis not present

## 2018-05-12 DIAGNOSIS — Z90710 Acquired absence of both cervix and uterus: Secondary | ICD-10-CM

## 2018-05-12 DIAGNOSIS — Z880 Allergy status to penicillin: Secondary | ICD-10-CM

## 2018-05-12 DIAGNOSIS — R87613 High grade squamous intraepithelial lesion on cytologic smear of cervix (HGSIL): Secondary | ICD-10-CM | POA: Diagnosis present

## 2018-05-12 DIAGNOSIS — Z8249 Family history of ischemic heart disease and other diseases of the circulatory system: Secondary | ICD-10-CM | POA: Diagnosis not present

## 2018-05-12 DIAGNOSIS — N9419 Other specified dyspareunia: Secondary | ICD-10-CM | POA: Diagnosis not present

## 2018-05-12 DIAGNOSIS — E039 Hypothyroidism, unspecified: Secondary | ICD-10-CM | POA: Diagnosis present

## 2018-05-12 HISTORY — PX: SALPINGOOPHORECTOMY: SHX82

## 2018-05-12 HISTORY — PX: ABDOMINAL HYSTERECTOMY: SHX81

## 2018-05-12 SURGERY — HYSTERECTOMY, ABDOMINAL
Anesthesia: General | Site: Abdomen

## 2018-05-12 MED ORDER — ENOXAPARIN SODIUM 40 MG/0.4ML ~~LOC~~ SOLN
40.0000 mg | SUBCUTANEOUS | Status: DC
  Administered 2018-05-13: 40 mg via SUBCUTANEOUS
  Filled 2018-05-12: qty 0.4

## 2018-05-12 MED ORDER — ONDANSETRON HCL 4 MG/2ML IJ SOLN
INTRAMUSCULAR | Status: AC
  Filled 2018-05-12: qty 2

## 2018-05-12 MED ORDER — GLYCOPYRROLATE PF 0.2 MG/ML IJ SOSY
PREFILLED_SYRINGE | INTRAMUSCULAR | Status: DC | PRN
  Administered 2018-05-12: .2 mg via INTRAVENOUS

## 2018-05-12 MED ORDER — FENTANYL CITRATE (PF) 100 MCG/2ML IJ SOLN
INTRAMUSCULAR | Status: AC
  Filled 2018-05-12: qty 2

## 2018-05-12 MED ORDER — ONDANSETRON HCL 4 MG PO TABS: 8.0000 mg | ORAL_TABLET | Freq: Four times a day (QID) | ORAL | Status: DC | PRN

## 2018-05-12 MED ORDER — FENTANYL CITRATE (PF) 100 MCG/2ML IJ SOLN
50.0000 ug | INTRAMUSCULAR | Status: DC | PRN
  Administered 2018-05-12 (×2): 50 ug via INTRAVENOUS

## 2018-05-12 MED ORDER — SUGAMMADEX SODIUM 200 MG/2ML IV SOLN
INTRAVENOUS | Status: DC | PRN
  Administered 2018-05-12: 136 mg via INTRAVENOUS

## 2018-05-12 MED ORDER — OXYCODONE HCL 5 MG PO TABS
5.0000 mg | ORAL_TABLET | ORAL | Status: DC | PRN
  Administered 2018-05-12: 10 mg via ORAL
  Administered 2018-05-12: 5 mg via ORAL
  Administered 2018-05-12: 10 mg via ORAL
  Administered 2018-05-13 (×2): 5 mg via ORAL
  Administered 2018-05-13 (×2): 10 mg via ORAL
  Filled 2018-05-12: qty 2
  Filled 2018-05-12: qty 1
  Filled 2018-05-12 (×2): qty 2
  Filled 2018-05-12 (×2): qty 1
  Filled 2018-05-12: qty 2

## 2018-05-12 MED ORDER — MORPHINE SULFATE (PF) 2 MG/ML IV SOLN
1.0000 mg | INTRAVENOUS | Status: DC | PRN
  Administered 2018-05-12 (×6): 2 mg via INTRAVENOUS
  Filled 2018-05-12 (×6): qty 1

## 2018-05-12 MED ORDER — KETOROLAC TROMETHAMINE 30 MG/ML IJ SOLN
30.0000 mg | Freq: Once | INTRAMUSCULAR | Status: AC
  Administered 2018-05-12: 30 mg via INTRAVENOUS
  Filled 2018-05-12: qty 1

## 2018-05-12 MED ORDER — HYDROCODONE-ACETAMINOPHEN 7.5-325 MG PO TABS: 1.0000 | ORAL_TABLET | Freq: Once | ORAL | Status: DC | PRN

## 2018-05-12 MED ORDER — ONDANSETRON HCL 4 MG/2ML IJ SOLN
INTRAMUSCULAR | Status: AC
  Filled 2018-05-12: qty 4

## 2018-05-12 MED ORDER — SENNOSIDES-DOCUSATE SODIUM 8.6-50 MG PO TABS: 1.0000 | ORAL_TABLET | Freq: Every evening | ORAL | Status: DC | PRN

## 2018-05-12 MED ORDER — BUPIVACAINE LIPOSOME 1.3 % IJ SUSP: 20.0000 mL | Freq: Once | INTRAMUSCULAR | Status: DC

## 2018-05-12 MED ORDER — ROCURONIUM BROMIDE 50 MG/5ML IV SOSY
PREFILLED_SYRINGE | INTRAVENOUS | Status: DC | PRN
  Administered 2018-05-12: 50 mg via INTRAVENOUS
  Administered 2018-05-12: 20 mg via INTRAVENOUS

## 2018-05-12 MED ORDER — DIPHENHYDRAMINE HCL 50 MG/ML IJ SOLN: 25.0000 mg | Freq: Four times a day (QID) | INTRAMUSCULAR | Status: DC | PRN

## 2018-05-12 MED ORDER — PROPOFOL 10 MG/ML IV BOLUS
INTRAVENOUS | Status: AC
  Filled 2018-05-12: qty 20

## 2018-05-12 MED ORDER — SODIUM CHLORIDE 0.9 % IV SOLN: 8.0000 mg | Freq: Four times a day (QID) | INTRAVENOUS | Status: DC | PRN

## 2018-05-12 MED ORDER — LIDOCAINE 2% (20 MG/ML) 5 ML SYRINGE
INTRAMUSCULAR | Status: DC | PRN
  Administered 2018-05-12: 40 mg via INTRAVENOUS

## 2018-05-12 MED ORDER — LEVOTHYROXINE SODIUM 75 MCG PO TABS
150.0000 ug | ORAL_TABLET | Freq: Every day | ORAL | Status: DC
  Administered 2018-05-13: 150 ug via ORAL
  Filled 2018-05-12: qty 2

## 2018-05-12 MED ORDER — PROMETHAZINE HCL 25 MG/ML IJ SOLN: 25.0000 mg | Freq: Four times a day (QID) | INTRAMUSCULAR | Status: DC | PRN

## 2018-05-12 MED ORDER — BUPIVACAINE LIPOSOME 1.3 % IJ SUSP
INTRAMUSCULAR | Status: AC
  Filled 2018-05-12: qty 20

## 2018-05-12 MED ORDER — MIDAZOLAM HCL 5 MG/5ML IJ SOLN
INTRAMUSCULAR | Status: DC | PRN
  Administered 2018-05-12: 2 mg via INTRAVENOUS

## 2018-05-12 MED ORDER — FENTANYL CITRATE (PF) 250 MCG/5ML IJ SOLN
INTRAMUSCULAR | Status: AC
  Filled 2018-05-12: qty 5

## 2018-05-12 MED ORDER — MIDAZOLAM HCL 2 MG/2ML IJ SOLN: 0.5000 mg | Freq: Once | INTRAMUSCULAR | Status: DC | PRN

## 2018-05-12 MED ORDER — FENTANYL CITRATE (PF) 100 MCG/2ML IJ SOLN
INTRAMUSCULAR | Status: DC | PRN
  Administered 2018-05-12: 50 ug via INTRAVENOUS
  Administered 2018-05-12: 100 ug via INTRAVENOUS
  Administered 2018-05-12 (×3): 50 ug via INTRAVENOUS

## 2018-05-12 MED ORDER — SEVOFLURANE IN SOLN
RESPIRATORY_TRACT | Status: AC
  Filled 2018-05-12: qty 250

## 2018-05-12 MED ORDER — SUCCINYLCHOLINE CHLORIDE 200 MG/10ML IV SOSY
PREFILLED_SYRINGE | INTRAVENOUS | Status: AC
  Filled 2018-05-12: qty 10

## 2018-05-12 MED ORDER — LACTATED RINGERS IV SOLN
INTRAVENOUS | Status: DC
  Administered 2018-05-12 (×2): via INTRAVENOUS

## 2018-05-12 MED ORDER — MIDAZOLAM HCL 2 MG/2ML IJ SOLN
INTRAMUSCULAR | Status: AC
  Filled 2018-05-12: qty 2

## 2018-05-12 MED ORDER — DOCUSATE SODIUM 100 MG PO CAPS
100.0000 mg | ORAL_CAPSULE | Freq: Two times a day (BID) | ORAL | Status: DC
  Administered 2018-05-12 – 2018-05-13 (×3): 100 mg via ORAL
  Filled 2018-05-12 (×3): qty 1

## 2018-05-12 MED ORDER — DEXAMETHASONE SODIUM PHOSPHATE 10 MG/ML IJ SOLN
INTRAMUSCULAR | Status: AC
  Filled 2018-05-12: qty 2

## 2018-05-12 MED ORDER — GLYCOPYRROLATE PF 0.2 MG/ML IJ SOSY
PREFILLED_SYRINGE | INTRAMUSCULAR | Status: AC
  Filled 2018-05-12: qty 2

## 2018-05-12 MED ORDER — ZOLPIDEM TARTRATE 5 MG PO TABS: 5.0000 mg | ORAL_TABLET | Freq: Every evening | ORAL | Status: DC | PRN

## 2018-05-12 MED ORDER — PROPOFOL 10 MG/ML IV BOLUS
INTRAVENOUS | Status: DC | PRN
  Administered 2018-05-12: 150 mg via INTRAVENOUS

## 2018-05-12 MED ORDER — PROMETHAZINE HCL 25 MG/ML IJ SOLN
6.2500 mg | INTRAMUSCULAR | Status: AC | PRN
  Administered 2018-05-12 (×2): 6.25 mg via INTRAVENOUS
  Filled 2018-05-12: qty 1

## 2018-05-12 MED ORDER — SUGAMMADEX SODIUM 200 MG/2ML IV SOLN
INTRAVENOUS | Status: AC
  Filled 2018-05-12: qty 2

## 2018-05-12 MED ORDER — DOXEPIN HCL 10 MG PO CAPS
10.0000 mg | ORAL_CAPSULE | Freq: Every day | ORAL | Status: DC
  Administered 2018-05-12: 10 mg via ORAL
  Filled 2018-05-12 (×2): qty 1

## 2018-05-12 MED ORDER — LIDOCAINE 2% (20 MG/ML) 5 ML SYRINGE
INTRAMUSCULAR | Status: AC
  Filled 2018-05-12: qty 10

## 2018-05-12 MED ORDER — FENTANYL CITRATE (PF) 100 MCG/2ML IJ SOLN
50.0000 ug | INTRAMUSCULAR | Status: DC | PRN
  Administered 2018-05-12: 50 ug via INTRAVENOUS
  Administered 2018-05-12: 100 ug via INTRAVENOUS
  Administered 2018-05-12: 50 ug via INTRAVENOUS
  Filled 2018-05-12 (×3): qty 2

## 2018-05-12 MED ORDER — LORATADINE 10 MG PO TABS
10.0000 mg | ORAL_TABLET | Freq: Every evening | ORAL | Status: DC
  Administered 2018-05-12: 10 mg via ORAL
  Filled 2018-05-12: qty 1

## 2018-05-12 MED ORDER — ROCURONIUM BROMIDE 10 MG/ML (PF) SYRINGE
PREFILLED_SYRINGE | INTRAVENOUS | Status: AC
  Filled 2018-05-12: qty 20

## 2018-05-12 MED ORDER — ALPRAZOLAM 0.5 MG PO TABS: 0.5000 mg | ORAL_TABLET | Freq: Every day | ORAL | Status: DC | PRN

## 2018-05-12 MED ORDER — DIPHENHYDRAMINE HCL 50 MG/ML IJ SOLN
INTRAMUSCULAR | Status: AC
  Filled 2018-05-12: qty 1

## 2018-05-12 MED ORDER — SODIUM CHLORIDE 0.9 % IR SOLN
Status: DC | PRN
  Administered 2018-05-12 (×3): 1000 mL

## 2018-05-12 MED ORDER — KETOROLAC TROMETHAMINE 30 MG/ML IJ SOLN
30.0000 mg | Freq: Four times a day (QID) | INTRAMUSCULAR | Status: AC
  Administered 2018-05-12 – 2018-05-13 (×3): 30 mg via INTRAVENOUS
  Filled 2018-05-12 (×3): qty 1

## 2018-05-12 MED ORDER — BUPIVACAINE LIPOSOME 1.3 % IJ SUSP
INTRAMUSCULAR | Status: DC | PRN
  Administered 2018-05-12: 20 mL

## 2018-05-12 MED ORDER — PANTOPRAZOLE SODIUM 40 MG PO TBEC
40.0000 mg | DELAYED_RELEASE_TABLET | Freq: Every day | ORAL | Status: DC
  Administered 2018-05-12 – 2018-05-13 (×2): 40 mg via ORAL
  Filled 2018-05-12 (×2): qty 1

## 2018-05-12 MED ORDER — CEFAZOLIN SODIUM-DEXTROSE 2-4 GM/100ML-% IV SOLN
2.0000 g | INTRAVENOUS | Status: AC
  Administered 2018-05-12: 2 g via INTRAVENOUS
  Filled 2018-05-12: qty 100

## 2018-05-12 MED ORDER — BISACODYL 10 MG RE SUPP: 10.0000 mg | Freq: Every day | RECTAL | Status: DC | PRN

## 2018-05-12 MED ORDER — ALUM & MAG HYDROXIDE-SIMETH 200-200-20 MG/5ML PO SUSP: 30.0000 mL | ORAL | Status: DC | PRN

## 2018-05-12 SURGICAL SUPPLY — 45 items
CELLS DAT CNTRL 66122 CELL SVR (MISCELLANEOUS) ×2 IMPLANT
CLOTH BEACON ORANGE TIMEOUT ST (SAFETY) ×4 IMPLANT
COVER LIGHT HANDLE STERIS (MISCELLANEOUS) ×8 IMPLANT
COVER WAND RF STERILE (DRAPES) ×2 IMPLANT
DERMABOND ADVANCED (GAUZE/BANDAGES/DRESSINGS) ×2
DERMABOND ADVANCED .7 DNX12 (GAUZE/BANDAGES/DRESSINGS) ×2 IMPLANT
DRAPE WARM FLUID 44X44 (DRAPE) ×4 IMPLANT
DRSG OPSITE POSTOP 4X8 (GAUZE/BANDAGES/DRESSINGS) ×4 IMPLANT
ELECT REM PT RETURN 9FT ADLT (ELECTROSURGICAL) ×4
ELECTRODE REM PT RTRN 9FT ADLT (ELECTROSURGICAL) ×2 IMPLANT
GLOVE BIOGEL PI IND STRL 7.0 (GLOVE) ×8 IMPLANT
GLOVE BIOGEL PI IND STRL 8 (GLOVE) ×2 IMPLANT
GLOVE BIOGEL PI INDICATOR 7.0 (GLOVE) ×8
GLOVE BIOGEL PI INDICATOR 8 (GLOVE) ×2
GLOVE ECLIPSE 8.0 STRL XLNG CF (GLOVE) ×4 IMPLANT
GOWN STRL REUS W/TWL LRG LVL3 (GOWN DISPOSABLE) ×8 IMPLANT
GOWN STRL REUS W/TWL XL LVL3 (GOWN DISPOSABLE) ×4 IMPLANT
INST SET MAJOR GENERAL (KITS) ×4 IMPLANT
KIT BLADEGUARD II DBL (SET/KITS/TRAYS/PACK) ×2 IMPLANT
KIT TURNOVER KIT A (KITS) ×4 IMPLANT
MANIFOLD NEPTUNE II (INSTRUMENTS) ×4 IMPLANT
NDL HYPO 18GX1.5 BLUNT FILL (NEEDLE) IMPLANT
NDL HYPO 21X1.5 SAFETY (NEEDLE) ×2 IMPLANT
NEEDLE HYPO 18GX1.5 BLUNT FILL (NEEDLE) ×4 IMPLANT
NEEDLE HYPO 21X1.5 SAFETY (NEEDLE) ×4 IMPLANT
NS IRRIG 1000ML POUR BTL (IV SOLUTION) ×10 IMPLANT
PACK ABDOMINAL MAJOR (CUSTOM PROCEDURE TRAY) ×4 IMPLANT
PAD ARMBOARD 7.5X6 YLW CONV (MISCELLANEOUS) ×4 IMPLANT
PENCIL HANDSWITCHING (ELECTRODE) ×2 IMPLANT
RETRACTOR WND ALEXIS 18 MED (MISCELLANEOUS) IMPLANT
RTRCTR WOUND ALEXIS 18CM MED (MISCELLANEOUS) ×4
SET BASIN LINEN APH (SET/KITS/TRAYS/PACK) ×4 IMPLANT
SPONGE LAP 18X18 RF (DISPOSABLE) ×4 IMPLANT
SUT CHROMIC 0 CT 1 (SUTURE) ×4 IMPLANT
SUT MON AB 3-0 SH 27 (SUTURE) ×4 IMPLANT
SUT VIC AB 0 CT1 27 (SUTURE) ×8
SUT VIC AB 0 CT1 27XBRD ANTBC (SUTURE) ×2 IMPLANT
SUT VIC AB 0 CT1 27XCR 8 STRN (SUTURE) ×4 IMPLANT
SUT VIC AB 0 CTX 36 (SUTURE) ×3
SUT VIC AB 0 CTX36XBRD ANTBCTR (SUTURE) ×2 IMPLANT
SUT VICRYL 3 0 (SUTURE) ×4 IMPLANT
SYR 20CC LL (SYRINGE) ×4 IMPLANT
SYR 30ML LL (SYRINGE) ×2 IMPLANT
TOWEL SURG RFD BLUE STRL DISP (DISPOSABLE) ×4 IMPLANT
TRAY FOLEY MTR SLVR 16FR STAT (SET/KITS/TRAYS/PACK) ×4 IMPLANT

## 2018-05-12 NOTE — Anesthesia Procedure Notes (Signed)
Procedure Name: Intubation Date/Time: 05/12/2018 9:31 AM Performed by: Andree Elk, Parris Cudworth A, CRNA Pre-anesthesia Checklist: Patient identified, Patient being monitored, Timeout performed, Emergency Drugs available and Suction available Patient Re-evaluated:Patient Re-evaluated prior to induction Oxygen Delivery Method: Circle system utilized Preoxygenation: Pre-oxygenation with 100% oxygen Induction Type: IV induction Ventilation: Mask ventilation without difficulty Laryngoscope Size: Mac and 3 Grade View: Grade I Tube type: Oral Tube size: 7.0 mm Number of attempts: 1 Airway Equipment and Method: Stylet Placement Confirmation: ETT inserted through vocal cords under direct vision,  positive ETCO2 and breath sounds checked- equal and bilateral Secured at: 21 cm Tube secured with: Tape Dental Injury: Teeth and Oropharynx as per pre-operative assessment

## 2018-05-12 NOTE — Anesthesia Preprocedure Evaluation (Signed)
Anesthesia Evaluation  Patient identified by MRN, date of birth, ID band Patient awake    Reviewed: Allergy & Precautions, NPO status , Patient's Chart, lab work & pertinent test results  History of Anesthesia Complications (+) PONV  Airway Mallampati: I  TM Distance: >3 FB Neck ROM: Full    Dental no notable dental hx. (+) Teeth Intact   Pulmonary neg pulmonary ROS,    Pulmonary exam normal breath sounds clear to auscultation       Cardiovascular Exercise Tolerance: Good negative cardio ROS Normal cardiovascular examI Rhythm:Regular Rate:Normal  State was told MVP in the past -no limitations  Denies tachy episodes   Neuro/Psych negative neurological ROS  negative psych ROS   GI/Hepatic negative GI ROS, Neg liver ROS,   Endo/Other  Hypothyroidism   Renal/GU negative Renal ROS  negative genitourinary   Musculoskeletal negative musculoskeletal ROS (+)   Abdominal   Peds negative pediatric ROS (+)  Hematology negative hematology ROS (+)   Anesthesia Other Findings   Reproductive/Obstetrics negative OB ROS                             Anesthesia Physical Anesthesia Plan  ASA: II  Anesthesia Plan: General   Post-op Pain Management:    Induction: Intravenous  PONV Risk Score and Plan:   Airway Management Planned: Oral ETT  Additional Equipment:   Intra-op Plan:   Post-operative Plan: Extubation in OR  Informed Consent: I have reviewed the patients History and Physical, chart, labs and discussed the procedure including the risks, benefits and alternatives for the proposed anesthesia with the patient or authorized representative who has indicated his/her understanding and acceptance.     Dental advisory given  Plan Discussed with: CRNA  Anesthesia Plan Comments: (Prefers No dilaudid  Fent /Morphine OK)        Anesthesia Quick Evaluation

## 2018-05-12 NOTE — Transfer of Care (Signed)
Immediate Anesthesia Transfer of Care Note  Patient: April Jordan  Procedure(s) Performed: HYSTERECTOMY ABDOMINAL (N/A Abdomen) BILATERAL SALPINGO OOPHORECTOMY (Bilateral Abdomen)  Patient Location: PACU  Anesthesia Type:General  Level of Consciousness: awake, oriented and patient cooperative  Airway & Oxygen Therapy: Patient Spontanous Breathing and Patient connected to nasal cannula oxygen  Post-op Assessment: Report given to RN and Post -op Vital signs reviewed and stable  Post vital signs: Reviewed and stable  Last Vitals:  Vitals Value Taken Time  BP 139/75 05/12/2018 11:00 AM  Temp    Pulse 62 05/12/2018 11:02 AM  Resp 10 05/12/2018 11:02 AM  SpO2 96 % 05/12/2018 11:02 AM  Vitals shown include unvalidated device data.  Last Pain:  Vitals:   05/12/18 0838  TempSrc: Oral  PainSc: 0-No pain      Patients Stated Pain Goal: 5 (05/12/18 5056)  Complications: No apparent anesthesia complications

## 2018-05-12 NOTE — H&P (Signed)
Preoperative History and Physical  April Jordan is a 48 y.o. G1P1001 with No LMP recorded (lmp unknown). Patient has had an ablation. admitted for a TAH BSO.  Pt had laser conization for cervical HSIL with large lesion extending into the endocervical canal.  Pathology confirmed with negative margins noted.  However her first follow up Pap returned as HSIL, colposcopy was again inadequate this time due to previous conization.  Additionally she has developed post ablation dyspareunia due to the healing post laser because the lesion extended onto the vagina.  As a result will proceed with definitive surgical management TAH BSO, she is most likely perimenopausal  PMH:    Past Medical History:  Diagnosis Date  . Abnormal Pap smear of cervix 06/22/2017   [facility], -HPV, will get colpo  . Complication of anesthesia   . Heart murmur   . Hypothyroidism   . Mitral valve prolapse 1986  . Mucous membrane pemphigoid 1999  . Pemphigoid   . PONV (postoperative nausea and vomiting)   . Thyroid disease    hypothyroid  . Vaginal Pap smear, abnormal     PSH:     Past Surgical History:  Procedure Laterality Date  . CERVICAL CONIZATION W/BX N/A 08/12/2017   Procedure: LASER CONIZATION CERVIX WITH BIOPSY;  Surgeon: Lazaro Arms, MD;  Location: [facility];  Service: Gynecology;  Laterality: N/A;  . CERVICAL DISC SURGERY    . CHOLECYSTECTOMY N/A 07/13/2013   Procedure: LAPAROSCOPIC CHOLECYSTECTOMY;  Surgeon: Dalia Heading, MD;  Location: [facility];  Service: General;  Laterality: N/A;  . COLPOSCOPY    . TUBAL LIGATION    . uterine ablation      POb/GynH:      OB History    Gravida  1   Para  1   Term  1   Preterm      AB      Living  1     SAB      TAB      Ectopic      Multiple      Live Births              SH:   Social History   Tobacco Use  . Smoking status: Never Smoker  . Smokeless tobacco: Never Used  Substance Use Topics  . Alcohol use: Yes    Comment: socially  .  Drug use: No    FH:    Family History  Problem Relation Age of Onset  . Hypertension Mother   . Hypertension Father   . Hypertension Paternal Grandmother      Allergies:  Allergies  Allergen Reactions  . Amoxicillin Rash    DID THE REACTION INVOLVE: Swelling of the face/tongue/throat, SOB, or low BP? No Sudden or severe rash/hives, skin peeling, or the inside of the mouth or nose? No Did it require medical treatment? No When did it last happen?10 years ago If all above answers are "NO", may proceed with cephalosporin use.   . Chlorhexidine Gluconate Rash  . Ciprofloxacin Rash  . Levaquin [Levofloxacin] Rash  . Sulfa Antibiotics Rash    Medications:       Current Facility-Administered Medications:  .  bupivacaine liposome (EXPAREL) 1.3 % injection 266 mg, 20 mL, Infiltration, Once, Aimar Shrewsbury H, MD .  ceFAZolin (ANCEF) IVPB 2g/100 mL premix, 2 g, Intravenous, On Call to OR, Lazaro Arms, MD .  lactated ringers infusion, , Intravenous, Continuous, Nabonsal, Fara Boros, MD, Last Rate: 50  mL/hr at 05/12/18 0841  Review of Systems:   Review of Systems  Constitutional: Negative for fever, chills, weight loss, malaise/fatigue and diaphoresis.  HENT: Negative for hearing loss, ear pain, nosebleeds, congestion, sore throat, neck pain, tinnitus and ear discharge.   Eyes: Negative for blurred vision, double vision, photophobia, pain, discharge and redness.  Respiratory: Negative for cough, hemoptysis, sputum production, shortness of breath, wheezing and stridor.   Cardiovascular: Negative for chest pain, palpitations, orthopnea, claudication, leg swelling and PND.  Gastrointestinal: Positive for abdominal pain. Negative for heartburn, nausea, vomiting, diarrhea, constipation, blood in stool and melena.  Genitourinary: Negative for dysuria, urgency, frequency, hematuria and flank pain.  Musculoskeletal: Negative for myalgias, back pain, joint pain and falls.  Skin: Negative for  itching and rash.  Neurological: Negative for dizziness, tingling, tremors, sensory change, speech change, focal weakness, seizures, loss of consciousness, weakness and headaches.  Endo/Heme/Allergies: Negative for environmental allergies and polydipsia. Does not bruise/bleed easily.  Psychiatric/Behavioral: Negative for depression, suicidal ideas, hallucinations, memory loss and substance abuse. The patient is not nervous/anxious and does not have insomnia.      PHYSICAL EXAM:  Blood pressure 127/84, pulse 77, temperature 98.2 F (36.8 C), temperature source Oral, resp. rate 16, SpO2 100 %.    Vitals reviewed. Constitutional: She is oriented to person, place, and time. She appears well-developed and well-nourished.  HENT:  Head: Normocephalic and atraumatic.  Right Ear: External ear normal.  Left Ear: External ear normal.  Nose: Nose normal.  Mouth/Throat: Oropharynx is clear and moist.  Eyes: Conjunctivae and EOM are normal. Pupils are equal, round, and reactive to light. Right eye exhibits no discharge. Left eye exhibits no discharge. No scleral icterus.  Neck: Normal range of motion. Neck supple. No tracheal deviation present. No thyromegaly present.  Cardiovascular: Normal rate, regular rhythm, normal heart sounds and intact distal pulses.  Exam reveals no gallop and no friction rub.   No murmur heard. Respiratory: Effort normal and breath sounds normal. No respiratory distress. She has no wheezes. She has no rales. She exhibits no tenderness.  GI: Soft. Bowel sounds are normal. She exhibits no distension and no mass. There is tenderness. There is no rebound and no guarding.  Genitourinary:       Vulva is normal without lesions Vagina is pink moist without discharge Cervix per HPI Uterus is normal size, contour, position, consistency, mobility, non-tender Adnexa is negative  Musculoskeletal: Normal range of motion. She exhibits no edema and no tenderness.  Neurological: She is  alert and oriented to person, place, and time. She has normal reflexes. She displays normal reflexes. No cranial nerve deficit. She exhibits normal muscle tone. Coordination normal.  Skin: Skin is warm and dry. No rash noted. No erythema. No pallor.  Psychiatric: She has a normal mood and affect. Her behavior is normal. Judgment and thought content normal.    Labs: Results for orders placed or performed during the hospital encounter of 05/10/18 (from the past 336 hour(s))  CBC   Collection Time: 05/10/18  9:17 AM  Result Value Ref Range   WBC 8.4 4.0 - 10.5 K/uL   RBC 4.28 3.87 - 5.11 MIL/uL   Hemoglobin 12.8 12.0 - 15.0 g/dL   HCT 27.5 17.0 - 01.7 %   MCV 95.8 80.0 - 100.0 fL   MCH 29.9 26.0 - 34.0 pg   MCHC 31.2 30.0 - 36.0 g/dL   RDW 49.4 49.6 - 75.9 %   Platelets 398 150 - 400 K/uL  nRBC 0.0 0.0 - 0.2 %  Comprehensive metabolic panel   Collection Time: 05/10/18  9:17 AM  Result Value Ref Range   Sodium 135 135 - 145 mmol/L   Potassium 4.2 3.5 - 5.1 mmol/L   Chloride 103 98 - 111 mmol/L   CO2 26 22 - 32 mmol/L   Glucose, Bld 88 70 - 99 mg/dL   BUN 15 6 - 20 mg/dL   Creatinine, Ser 4.090.86 0.44 - 1.00 mg/dL   Calcium 8.9 8.9 - 81.110.3 mg/dL   Total Protein 7.5 6.5 - 8.1 g/dL   Albumin 4.0 3.5 - 5.0 g/dL   AST 54 (H) 15 - 41 U/L   ALT 39 0 - 44 U/L   Alkaline Phosphatase 52 38 - 126 U/L   Total Bilirubin 0.7 0.3 - 1.2 mg/dL   GFR calc non Af Amer >60 >60 mL/min   GFR calc Af Amer >60 >60 mL/min   Anion gap 6 5 - 15  hCG, quantitative, pregnancy   Collection Time: 05/10/18  9:17 AM  Result Value Ref Range   hCG, Beta Chain, Quant, S <1 <5 mIU/mL  Rapid HIV screen (HIV 1/2 Ab+Ag)   Collection Time: 05/10/18  9:17 AM  Result Value Ref Range   HIV-1 P24 Antigen - HIV24 NON REACTIVE NON REACTIVE   HIV 1/2 Antibodies NON REACTIVE NON REACTIVE   Interpretation (HIV Ag Ab)      A non reactive test result means that HIV 1 or HIV 2 antibodies and HIV 1 p24 antigen were not  detected in the specimen.  Urinalysis, Routine w reflex microscopic   Collection Time: 05/10/18  9:17 AM  Result Value Ref Range   Color, Urine STRAW (A) YELLOW   APPearance CLEAR CLEAR   Specific Gravity, Urine 1.002 (L) 1.005 - 1.030   pH 8.0 5.0 - 8.0   Glucose, UA NEGATIVE NEGATIVE mg/dL   Hgb urine dipstick NEGATIVE NEGATIVE   Bilirubin Urine NEGATIVE NEGATIVE   Ketones, ur NEGATIVE NEGATIVE mg/dL   Protein, ur NEGATIVE NEGATIVE mg/dL   Nitrite NEGATIVE NEGATIVE   Leukocytes, UA SMALL (A) NEGATIVE   RBC / HPF 0-5 0 - 5 RBC/hpf   WBC, UA 0-5 0 - 5 WBC/hpf   Bacteria, UA RARE (A) NONE SEEN   Squamous Epithelial / LPF 0-5 0 - 5  Type and screen   Collection Time: 05/10/18  9:17 AM  Result Value Ref Range   ABO/RH(D) AB POS    Antibody Screen NEG    Sample Expiration 05/24/2018    Extend sample reason      NO TRANSFUSIONS OR PREGNANCY IN THE PAST 3 MONTHS Performed at [facility], [address] [place], [place] [id number]     EKG: Orders placed or performed during the hospital encounter of 08/10/17  . EKG 12-Lead  . EKG 12-Lead    Imaging Studies: No results found.    Assessment: Recurrent HSIL of cervix with inadequate colposcopy Dyspareunia  Patient Active Problem List   Diagnosis Date Noted  . S/P TAH-BSO 05/12/2018  . History of abnormal cervical Pap smear 03/22/2018  . S/P laser of cervix 03/22/2018  . Dyspareunia in female 03/22/2018  . Hot flashes 03/22/2018  . Abnormal Pap smear of cervix 06/22/2017  . Thyroid nodule 06/16/2017  . Encounter for gynecological examination with Papanicolaou smear of cervix 06/16/2017  . Unspecified hypothyroidism 03/10/2013    Plan: TAH BSO  Pt understands the risks of surgery including but not limited t  excessive bleeding requiring transfusion or reoperation, post-operative infection requiring prolonged hospitalization or re-hospitalization and antibiotic therapy, and damage to other organs including bladder,  bowel, ureters and major vessels.  The patient also understands the alternative treatment options which were discussed in full.  All questions were answered.  Lazaro ArmsLuther H Nathian Stencil 05/12/2018 9:11 AM   Lazaro ArmsLuther H Connie Lasater 05/12/2018 9:11 AM

## 2018-05-12 NOTE — Op Note (Signed)
Preoperative diagnosis:  1.  HSIL of the cervix                                          2.  S/P Laser conization with clear margins                                         3.  Inadequate colposcopy with no cervix to grasp for vaginal removal                                         4.  Dyspareunia due to post operative scarring from laser surgery  Postoperative diagnosis:  Same as above   Procedure:  Abdominal hysterectomy, total, with removal of both tubes and ovaries through a mini lap incision  Surgeon:  Lazaro ArmsEURE, H  Assistant:    Anesthesia:  General endotracheal  Preoperative clinical summary:    April DoomJennifer S Jordan is a 48 y.o. G1P1001 with No LMP recorded (lmp unknown). Patient has had an ablation. admitted for a TAH BSO.  Pt had laser conization for cervical HSIL with large lesion extending into the endocervical canal.  Pathology confirmed with negative margins noted.  However her first follow up Pap returned as HSIL, colposcopy was again inadequate this time due to previous conization.  Additionally she has developed post ablation dyspareunia due to the healing post laser because the lesion extended onto the vagina.  As a result will proceed with definitive surgical management TAH BSO, she is most likely perimenopausal  Intraoperative findings: normal uterus tubes and ovaries  Description of operation:  Patient was taken to the operating room and placed in the supine position where she underwent general endotracheal anesthesia.  She was then prepped and draped in the usual sterile fashion and a Foley catheter was placed for continuous bladder drainage.  A 8cm mini lap skin incision was made and carried down sharply to the rectus fascia which was scored in the midline and extended laterally.  The fascia was taken off the muscles superiorly and inferiorly without difficulty.  The muscles were divided.  The peritoneal cavity was entered.  An medium Alexis self-retaining retractor was  placed.  The upper abdomen was packed away. Both uterine cornu were grasped with Coker clamps.  The left round ligament was suture ligated and coagulated with the electrocautery unit.  The left vesicouterine serosal flap was created.  An avascular window in in the peritoneum was created and the utero-ovarian ligament was cross clamped, cut and suture ligated.  The left infundibulo pelvic ligament was cross clamped, the left tube and ovary was excised and double fore and aft suture ligation was performed.The right round ligament was suture ligated and cut with the electrocautery unit.  The vesicouterine serosal flap on the right was created.  An avascular window in the peritoneum was created and the right utero-ovarian ligament was cross clamped, cut and double suture ligated. The right infundibulo pelvic ligament was cross clamped, the left tube and ovary was excised and double fore and aft suture ligation was performed. Thus both ovaries and tubes were removed.  The uterine vessels were skeletonized bilaterally.  The uterine vessels were clamped bilaterally,  then  cut and suture ligated.  Two more pedicles were taken down the cervix medial to the uterine vessels.  Each pedicle was clamped cut and suture ligated with good resulting hemostasis.  The vagina was cross clamped and the uterus and cervix were removed in total.  The vagina was closed with interrupted figure of eight sutures for closure and hemostasis.   The pelvis was irrigated vigorously and all pedicles were examined and found to be hemostatic.  The peritoneum was closed from side to side for hemostasis and prevent post operative adhesions to the vaginal cuff.  All specimens were sent to pathology for routine evaluation.  The Alexis self-retaining retractor was removed and the pelvis was irrigated vigorously.  All packs were removed and all counts were correct at this point x 3.  The muscles and peritoneum were reapproximated loosely.  The fascia was  closed with 0 Vicryl running.    The skin was closed using 3-0 Vicryl on a Keith needle in a subcuticular fashion.  Dermabond was then applied for additional wound integrity and to serve as a postoperative bacterial barrier.  The patient was awakened from anesthesia taken to the recovery room in good stable condition. All sponge instrument and needle counts were correct x 3.  The patient received 2 grams of Ancef and Toradol prophylactically preoperatively.  Estimated blood loss for the procedure was 200  cc.  , H 05/12/2018 10:47 AM

## 2018-05-12 NOTE — Anesthesia Postprocedure Evaluation (Signed)
Anesthesia Post Note  Patient: April OsmondJennifer S Jordan  Procedure(s) Performed: HYSTERECTOMY ABDOMINAL (N/A Abdomen) BILATERAL SALPINGO OOPHORECTOMY (Bilateral Abdomen)  Patient location during evaluation: PACU Anesthesia Type: General Level of consciousness: oriented and patient cooperative Pain management: pain level controlled Vital Signs Assessment: post-procedure vital signs reviewed and stable Respiratory status: spontaneous breathing Cardiovascular status: stable Postop Assessment: no apparent nausea or vomiting Anesthetic complications: no     Last Vitals:  Vitals:   05/12/18 0838 05/12/18 1100  BP: 127/84 139/75  Pulse: 77 92  Resp: 16 (!) 8  Temp: 36.8 C 36.8 C  SpO2: 100% 90%    Last Pain:  Vitals:   05/12/18 1100  TempSrc:   PainSc: 0-No pain                 ADAMS, AMY A

## 2018-05-12 NOTE — Progress Notes (Signed)
Placed IS in room. Patient in too much pain at this time to try to attempt. RT will attempt again later on today.

## 2018-05-13 ENCOUNTER — Encounter (HOSPITAL_COMMUNITY): Payer: Self-pay | Admitting: Obstetrics & Gynecology

## 2018-05-13 LAB — BASIC METABOLIC PANEL
Anion gap: 6 (ref 5–15)
BUN: 13 mg/dL (ref 6–20)
CO2: 27 mmol/L (ref 22–32)
Calcium: 8.4 mg/dL — ABNORMAL LOW (ref 8.9–10.3)
Chloride: 103 mmol/L (ref 98–111)
Creatinine, Ser: 0.78 mg/dL (ref 0.44–1.00)
GFR calc Af Amer: >60 mL/min (ref >60)
GFR calc non Af Amer: >60 mL/min (ref >60)
GLUCOSE: 94 mg/dL (ref 70–99)
Potassium: 4.5 mmol/L (ref 3.5–5.1)
Sodium: 136 mmol/L (ref 135–145)

## 2018-05-13 LAB — CBC
HCT: 35.4 % — ABNORMAL LOW (ref 36.0–46.0)
Hemoglobin: 11.1 g/dL — ABNORMAL LOW (ref 12.0–15.0)
MCH: 30.4 pg (ref 26.0–34.0)
MCHC: 31.4 g/dL (ref 30.0–36.0)
MCV: 97 fL (ref 80.0–100.0)
NRBC: 0 % (ref 0.0–0.2)
Platelets: 367 10*3/uL (ref 150–400)
RBC: 3.65 MIL/uL — ABNORMAL LOW (ref 3.87–5.11)
RDW: 12.8 % (ref 11.5–15.5)
WBC: 11.6 10*3/uL — ABNORMAL HIGH (ref 4.0–10.5)

## 2018-05-13 MED ORDER — OXYCODONE HCL 5 MG PO TABS: 5.0000 mg | ORAL_TABLET | ORAL | 0 refills | Status: DC | PRN

## 2018-05-13 MED ORDER — KETOROLAC TROMETHAMINE 10 MG PO TABS: 10.0000 mg | ORAL_TABLET | Freq: Three times a day (TID) | ORAL | 0 refills | Status: DC | PRN

## 2018-05-13 MED ORDER — ONDANSETRON HCL 8 MG PO TABS: 8.0000 mg | ORAL_TABLET | Freq: Four times a day (QID) | ORAL | 0 refills | Status: DC | PRN

## 2018-05-13 NOTE — Discharge Instructions (Signed)
Abdominal Hysterectomy, Care After °This sheet gives you information about how to care for yourself after your procedure. Your health care provider may also give you more specific instructions. If you have problems or questions, contact your health care provider. °What can I expect after the procedure? °After your procedure, it is common to have: °· Pain. °· Fatigue. °· Poor appetite. °· Less interest in sex. °· Vaginal bleeding and discharge. You may need to use a sanitary napkin after this procedure. °Follow these instructions at home: °Bathing °· Do not take baths, swim, or use a hot tub until your health care provider approves. Ask your health care provider if you can take showers. You may only be allowed to take sponge baths for bathing. °· Keep the bandage (dressing) dry until your health care provider says it can be removed. °Incision care ° °· Follow instructions from your health care provider about how to take care of your incision. Make sure you: °? Wash your hands with soap and water before you change your bandage (dressing). If soap and water are not available, use hand sanitizer. °? Change your dressing as told by your health care provider. °? Leave stitches (sutures), skin glue, or adhesive strips in place. These skin closures may need to stay in place for 2 weeks or longer. If adhesive strip edges start to loosen and curl up, you may trim the loose edges. Do not remove adhesive strips completely unless your health care provider tells you to do that. °· Check your incision area every day for signs of infection. Check for: °? Redness, swelling, or pain. °? Fluid or blood. °? Warmth. °? Pus or a bad smell. °Activity °· Do gentle, daily exercises as told by your health care provider. You may be told to take short walks every day and go farther each time. °· Do not lift anything that is heavier than 10 lb (4.5 kg), or the limit that your health care provider tells you, until he or she says that it is  safe. °· Do not drive or use heavy machinery while taking prescription pain medicine. °· Do not drive for 24 hours if you were given a medicine to help you relax (sedative). °· Follow your health care provider's instructions about exercise, driving, and general activities. Ask your health care provider what activities are safe for you. °Lifestyle °· Do not douche, use tampons, or have sex for at least 6 weeks or as told by your health care provider. °· Do not drink alcohol until your health care provider approves. °· Drink enough fluid to keep your urine clear or pale yellow. °· Try to have someone at home with you for the first 1-2 weeks to help. °· Do not use any products that contain nicotine or tobacco, such as cigarettes and e-cigarettes. These can delay healing. If you need help quitting, ask your health care provider. °General instructions °· Take over-the-counter and prescription medicines only as told by your health care provider. °· Do not take aspirin or ibuprofen. These medicines can cause bleeding. °· To prevent or treat constipation while you are taking prescription pain medicine, your health care provider may recommend that you: °? Drink enough fluid to keep your urine clear or pale yellow. °? Take over-the-counter or prescription medicines. °? Eat foods that are high in fiber, such as fresh fruits and vegetables, whole grains, and beans. °? Limit foods that are high in fat and processed sugars, such as fried and sweet foods. °· Keep all   follow-up visits as told by your health care provider. This is important. °Contact a health care provider if: °· You have chills or fever. °· You have redness, swelling, or pain around your incision. °· You have fluid or blood coming from your incision. °· Your incision feels warm to the touch. °· You have pus or a bad smell coming from your incision. °· Your incision breaks open. °· You feel dizzy or light-headed. °· You have pain or bleeding when you urinate. °· You  have persistent diarrhea. °· You have persistent nausea and vomiting. °· You have abnormal vaginal discharge. °· You have a rash. °· You have any type of abnormal reaction or you develop an allergy to your medicine. °· Your pain medicine does not help. °Get help right away if: °· You have a fever and your symptoms suddenly get worse. °· You have severe abdominal pain. °· You have shortness of breath. °· You faint. °· You have pain, swelling, or redness in your leg. °· You have heavy vaginal bleeding with blood clots. °Summary °· After your procedure, it is common to have pain, fatigue and vaginal discharge. °· Do not take baths, swim, or use a hot tub until your health care provider approves. Ask your health care provider if you can take showers. You may only be allowed to take sponge baths for bathing. °· Follow your health care provider's instructions about exercise, driving, and general activities. Ask your health care provider what activities are safe for you. °· Do not lift anything that is heavier than 10 lb (4.5 kg), or the limit that your health care provider tells you, until he or she says that it is safe. °· Try to have someone at home with you for the first 1-2 weeks to help. °This information is not intended to replace advice given to you by your health care provider. Make sure you discuss any questions you have with your health care provider. °Document Released: 11/01/2004 Document Revised: 04/02/2016 Document Reviewed: 04/02/2016 °Elsevier Interactive Patient Education © 2019 [employer] ° °

## 2018-05-13 NOTE — Discharge Summary (Signed)
Physician Discharge Summary  Patient ID: April Jordan MRN: [id number] DOB/AGE: June 17, 1970 48 y.o.  Admit date: 05/12/2018 Discharge date: 05/13/2018  Admission Diagnoses: S/p TAH BSO  Discharge Diagnoses:  Active Problems:   S/P TAH-BSO   S/P TAH-BSO (total abdominal hysterectomy and bilateral salpingo-oophorectomy)   Discharged Condition: good  Hospital Course: normal post op  Consults: None  Significant Diagnostic Studies: labs:     Treatments: surgery: TAH BSO  Discharge Exam: Blood pressure 135/80, pulse 61, temperature 98.3 F (36.8 C), temperature source Oral, resp. rate 16, SpO2 100 %. General appearance: alert, cooperative and no distress GI: soft, non-tender; bowel sounds normal; no masses,  no organomegaly  Disposition: Discharge disposition: 01-Home or Self Care       Discharge Instructions    Call MD for:  persistant nausea and vomiting   Complete by:  As directed    Call MD for:  severe uncontrolled pain   Complete by:  As directed    Call MD for:  temperature >100.4   Complete by:  As directed    Diet - low sodium heart healthy   Complete by:  As directed    Driving Restrictions   Complete by:  As directed    No driving for 1 week   Increase activity slowly   Complete by:  As directed    Leave dressing on - Keep it clean, dry, and intact until clinic visit   Complete by:  As directed    Lifting restrictions   Complete by:  As directed    Do not lift more than 10 pounds for 6 weeks   Sexual Activity Restrictions   Complete by:  As directed    None til specifically allowed, probably 8 weeks     Allergies as of 05/13/2018      Reactions   Amoxicillin Rash   DID THE REACTION INVOLVE: Swelling of the face/tongue/throat, SOB, or low BP? No Sudden or severe rash/hives, skin peeling, or the inside of the mouth or nose? No Did it require medical treatment? No When did it last happen?10 years ago If all above answers are "NO", may  proceed with cephalosporin use.   Chlorhexidine Gluconate Rash   Ciprofloxacin Rash   Levaquin [levofloxacin] Rash   Sulfa Antibiotics Rash      Medication List    STOP taking these medications   conjugated estrogens vaginal cream Commonly known as:  PREMARIN     TAKE these medications   acetaminophen 500 MG tablet Commonly known as:  TYLENOL Take 1,000 mg by mouth every 6 (six) hours as needed for moderate pain or headache.   ALPRAZolam 0.5 MG tablet Commonly known as:  XANAX Take 0.5 mg by mouth daily as needed for anxiety.   aspirin-acetaminophen-caffeine 250-250-65 MG tablet Commonly known as:  EXCEDRIN MIGRAINE Take 2 tablets by mouth every 6 (six) hours as needed for headache.   doxepin 10 MG capsule Commonly known as:  SINEQUAN Take 10 mg by mouth at bedtime.   esomeprazole 20 MG capsule Commonly known as:  NEXIUM Take 20 mg by mouth daily at 12 noon.   IBGARD PO Take 1 capsule by mouth daily.   ketorolac 10 MG tablet Commonly known as:  TORADOL Take 1 tablet (10 mg total) by mouth every 8 (eight) hours as needed.   levocetirizine 5 MG tablet Commonly known as:  XYZAL Take 5 mg by mouth every evening.   levothyroxine 150 MCG tablet Commonly known as:  SYNTHROID, LEVOTHROID  Take 150 mcg by mouth daily before breakfast.   ondansetron 8 MG tablet Commonly known as:  ZOFRAN Take 1 tablet (8 mg total) by mouth every 6 (six) hours as needed for nausea.   oxyCODONE 5 MG immediate release tablet Commonly known as:  Oxy IR/ROXICODONE Take 1-2 tablets (5-10 mg total) by mouth every 4 (four) hours as needed for moderate pain.   PROBIOTIC PO Take 1 capsule by mouth daily.   triamcinolone cream 0.1 % Commonly known as:  KENALOG Apply 1 application topically 2 (two) times daily as needed (for skin irritation/rash).      Follow-up Information    Lazaro Arms,  H, MD Follow up on 05/20/2018.   Specialties:  Obstetrics and Gynecology, Radiology Why:  post  op Contact information: [address] [place] [address] [phone]           Signed: Lazaro Arms H  05/13/2018, 1:12 PM

## 2018-05-13 NOTE — Progress Notes (Signed)
IV removed, WNL. D.C instructions given to pt. Verbalized understanding. Pt spouse at bedside to transport home.  

## 2018-05-14 ENCOUNTER — Other Ambulatory Visit: Payer: Self-pay | Admitting: *Deleted

## 2018-05-14 NOTE — Patient Outreach (Signed)
[facility]) Care Management  05/14/2018  SARRIAH LIME 05/31/1970 [id number]   Transition of care telephone call:   Subjective: Initial unsuccessful telephone call to patient's preferred number in order to complete transition of care assessment; no answer, left HIPAA compliant voicemail message requesting return call and advising patient this RNCM will call again on 05/17/18.  Pre-op screening call was completed on 05/10/18  Objective: Per the electronic medical record, Fleshia Fratangelo  was hospitalized at [facility] from 1/15-1/16/20 for total abdominal hysterectomy and bilateral salpingoophorectomy related to atypical squamous cells cannot exclude high grade squamous intraepithelial lesion on cytologic smear of cervix;   Comorbidities include: hypothyroidism, and history of abnormal cervical pap smears  She was discharged to home on 1/16 without the need for home health services or durable medical equipment per the discharge summary.  Plan: This RNCM will attempt another outreach on 05/17/18.    Bary Richard RN,CCM,CDE [facility] Care Management Coordinator Office Phone [phone] Office Fax [phone]

## 2018-05-17 ENCOUNTER — Other Ambulatory Visit: Payer: Self-pay | Admitting: *Deleted

## 2018-05-17 ENCOUNTER — Ambulatory Visit: Payer: Self-pay | Admitting: *Deleted

## 2018-05-17 NOTE — Patient Outreach (Signed)
[facility]) Care Management  05/17/2018  April Jordan Apr 25, 1971 [id number]   Transition of care call Initial outreach attempt: 05/14/18  Subjective: Second unsuccessful telephone call to patient's preferred (home) number in order to complete transition of care assessment; no answer, left HIPAA compliant message requesting return call. Pre-op screening call was completed on 05/10/18  Objective: Per the electronic medical record, April Jordan  was hospitalized at [facility] from 1/15-1/16/20 for total abdominal hysterectomy and bilateral salpingoophorectomy related to atypical squamous cells cannot exclude high grade squamous intraepithelial lesion on cytologic smear of cervix;   Comorbidities include: hypothyroidism, and history of abnormal cervical pap smears  She was discharged to home on 1/16 without the need for home health services or durable medical equipment per the discharge summary.  Plan: This RNCM will attempt a third and final outreach within 4 business days, if no return call from patient, will close case to [facility] services in 10 business days after initial outreach. Will route unsuccessful outreach letter with [facility] Care Management informational pamphlet and 24 Hour Nurse Advice Line magnet.   Bary Richard RN,CCM,CDE [facility] Care Management Coordinator Office Phone [phone] Office Fax [phone]

## 2018-05-20 ENCOUNTER — Encounter: Payer: Self-pay | Admitting: Obstetrics & Gynecology

## 2018-05-20 ENCOUNTER — Ambulatory Visit (INDEPENDENT_AMBULATORY_CARE_PROVIDER_SITE_OTHER): Payer: No Typology Code available for payment source | Admitting: Obstetrics & Gynecology

## 2018-05-20 VITALS — BP 114/78 | HR 66 | Ht 62.0 in | Wt 155.0 lb

## 2018-05-20 DIAGNOSIS — Z9079 Acquired absence of other genital organ(s): Secondary | ICD-10-CM

## 2018-05-20 DIAGNOSIS — Z90710 Acquired absence of both cervix and uterus: Secondary | ICD-10-CM

## 2018-05-20 DIAGNOSIS — Z90722 Acquired absence of ovaries, bilateral: Secondary | ICD-10-CM

## 2018-05-20 MED ORDER — KETOROLAC TROMETHAMINE 10 MG PO TABS: 10.0000 mg | ORAL_TABLET | Freq: Three times a day (TID) | ORAL | 0 refills | Status: DC | PRN

## 2018-05-20 MED ORDER — ESTRADIOL 1 MG/GM TD GEL: 1.0000 | Freq: Every day | TRANSDERMAL | 11 refills | Status: DC

## 2018-05-20 NOTE — Progress Notes (Signed)
  HPI: Patient returns for routine postoperative follow-up having undergone TAHBSO on 05/12/2018.  The patient's immediate postoperative recovery has been unremarkable. Since hospital discharge the patient reports no problems.   Current Outpatient Medications: acetaminophen (TYLENOL) 500 MG tablet, Take 1,000 mg by mouth every 6 (six) hours as needed for moderate pain or headache., Disp: , Rfl:  ALPRAZolam (XANAX) 0.5 MG tablet, Take 0.5 mg by mouth daily as needed for anxiety., Disp: , Rfl:  aspirin-acetaminophen-caffeine (EXCEDRIN MIGRAINE) 250-250-65 MG per tablet, Take 2 tablets by mouth every 6 (six) hours as needed for headache., Disp: , Rfl:  doxepin (SINEQUAN) 10 MG capsule, Take 10 mg by mouth at bedtime., Disp: , Rfl:  esomeprazole (NEXIUM) 20 MG capsule, Take 20 mg by mouth daily at 12 noon., Disp: , Rfl:  levocetirizine (XYZAL) 5 MG tablet, Take 5 mg by mouth every evening., Disp: , Rfl:  levothyroxine (SYNTHROID, LEVOTHROID) 150 MCG tablet, Take 150 mcg by mouth daily before breakfast., Disp: , Rfl:  Peppermint Oil (IBGARD PO), Take 1 capsule by mouth daily., Disp: , Rfl:  Probiotic Product (PROBIOTIC PO), Take 1 capsule by mouth daily., Disp: , Rfl:  triamcinolone cream (KENALOG) 0.1 %, Apply 1 application topically 2 (two) times daily as needed (for skin irritation/rash)., Disp: , Rfl:  ketorolac (TORADOL) 10 MG tablet, Take 1 tablet (10 mg total) by mouth every 8 (eight) hours as needed. (Patient not taking: Reported on 05/20/2018), Disp: 15 tablet, Rfl: 0 ondansetron (ZOFRAN) 8 MG tablet, Take 1 tablet (8 mg total) by mouth every 6 (six) hours as needed for nausea. (Patient not taking: Reported on 05/20/2018), Disp: 20 tablet, Rfl: 0 oxyCODONE (OXY IR/ROXICODONE) 5 MG immediate release tablet, Take 1-2 tablets (5-10 mg total) by mouth every 4 (four) hours as needed for moderate pain. (Patient not taking: Reported on 05/20/2018), Disp: 30 tablet, Rfl: 0  No current  facility-administered medications for this visit.     Blood pressure 114/78, pulse 66, height 5\' 2"  (1.575 m), weight 155 lb (70.3 kg).  Physical Exam: Incision clean dry intact Abdomen is soft  Diagnostic Tests:   Pathology: benign  Impression: S/p TAHBSO  Plan: Meds ordered this encounter  Medications  . Estradiol (DIVIGEL) 1 MG/GM GEL    Sig: Place 1 packet onto the skin daily.    Dispense:  30 g    Refill:  11  . ketorolac (TORADOL) 10 MG tablet    Sig: Take 1 tablet (10 mg total) by mouth every 8 (eight) hours as needed.    Dispense:  15 tablet    Refill:  0     Follow up: 4  weeks  Lazaro Arms, MD

## 2018-05-21 ENCOUNTER — Other Ambulatory Visit: Payer: Self-pay | Admitting: *Deleted

## 2018-05-21 ENCOUNTER — Ambulatory Visit: Payer: Self-pay | Admitting: *Deleted

## 2018-05-21 NOTE — Patient Outreach (Signed)
[facility]) Care Management  05/21/2018  April Jordan 1971/01/20 [id number]   Transition of care call    Subjective: Third attempt to reach patient successful. 2 HIPAA identifiers verified. Explained purpose of call and completed transition of care assessment.  April Jordan states she is doing well, saw her surgeon yesterday and will see him again on 06/17/18.   Objective:  Per chart review, patient with atypical squamous cells cannot exclude high grade squamous intraepithelial lesion on cytologic smear of cervix;  had abdominal hysterectomy and salpingoophorectomy on 05/12/18 at [facility]. She was discharged to home on 05/13/18 without the need for home health services or DME.  Assessment:  See transition of care flowsheet for assessment details.  Plan:  No ongoing care management needs identified so will close case to [facility] Care Management care management services. Unsuccessful outreach letter with [facility] Care Management pamphlet and 24 Hour Nurse Line Magnet to [employer] Care Management was routed to [employer] Care Management clinical pool on 05/17/18.  Bary Richard RN,CCM,CDE [facility] Care Management Coordinator Office Phone [phone] Office Fax [phone]

## 2018-06-07 ENCOUNTER — Other Ambulatory Visit (HOSPITAL_COMMUNITY): Payer: Self-pay | Admitting: Family Medicine

## 2018-06-07 DIAGNOSIS — Z1231 Encounter for screening mammogram for malignant neoplasm of breast: Secondary | ICD-10-CM

## 2018-06-17 ENCOUNTER — Ambulatory Visit (INDEPENDENT_AMBULATORY_CARE_PROVIDER_SITE_OTHER): Payer: No Typology Code available for payment source | Admitting: Obstetrics & Gynecology

## 2018-06-17 ENCOUNTER — Encounter: Payer: Self-pay | Admitting: Obstetrics & Gynecology

## 2018-06-17 ENCOUNTER — Other Ambulatory Visit: Payer: Self-pay

## 2018-06-17 VITALS — BP 117/80 | HR 78 | Ht 62.0 in | Wt 153.0 lb

## 2018-06-17 DIAGNOSIS — Z90710 Acquired absence of both cervix and uterus: Secondary | ICD-10-CM

## 2018-06-17 DIAGNOSIS — Z9079 Acquired absence of other genital organ(s): Secondary | ICD-10-CM

## 2018-06-17 DIAGNOSIS — Z90722 Acquired absence of ovaries, bilateral: Secondary | ICD-10-CM

## 2018-06-17 DIAGNOSIS — Z9889 Other specified postprocedural states: Secondary | ICD-10-CM

## 2018-06-17 DIAGNOSIS — N951 Menopausal and female climacteric states: Secondary | ICD-10-CM

## 2018-06-17 MED ORDER — ESTROGENS, CONJUGATED 0.625 MG/GM VA CREA: TOPICAL_CREAM | VAGINAL | 12 refills | Status: DC

## 2018-06-17 MED ORDER — ESTRADIOL 2 MG PO TABS: 2.0000 mg | ORAL_TABLET | Freq: Every day | ORAL | 11 refills | Status: DC

## 2018-06-17 NOTE — Progress Notes (Signed)
Patient ID: April Jordan, female   DOB: 04-Dec-1970, 48 y.o.   MRN: [id number]  HPI: Patient returns for routine postoperative follow-up having undergone TAHBSO on 05/12/2018.  The patient's immediate postoperative recovery has been unremarkable. Since hospital discharge the patient reports continued vasomotor symptoms.   Current Outpatient Medications: acetaminophen (TYLENOL) 500 MG tablet, Take 1,000 mg by mouth every 6 (six) hours as needed for moderate pain or headache., Disp: , Rfl:  aspirin-acetaminophen-caffeine (EXCEDRIN MIGRAINE) 250-250-65 MG per tablet, Take 2 tablets by mouth every 6 (six) hours as needed for headache., Disp: , Rfl:  Estradiol (DIVIGEL) 1 MG/GM GEL, Place 1 packet onto the skin daily., Disp: 30 g, Rfl: 11 levothyroxine (SYNTHROID, LEVOTHROID) 150 MCG tablet, Take 150 mcg by mouth daily before breakfast., Disp: , Rfl:  Peppermint Oil (IBGARD PO), Take 1 capsule by mouth daily., Disp: , Rfl:  triamcinolone cream (KENALOG) 0.1 %, Apply 1 application topically 2 (two) times daily as needed (for skin irritation/rash)., Disp: , Rfl:  conjugated estrogens (PREMARIN) vaginal cream, Use 1 gram nightly, Disp: 30 g, Rfl: 12 estradiol (ESTRACE) 2 MG tablet, Take 1 tablet (2 mg total) by mouth daily., Disp: 30 tablet, Rfl: 11  No current facility-administered medications for this visit.     Blood pressure 117/80, pulse 78, height 5\' 2"  (1.575 m), weight 153 lb (69.4 kg).  Physical Exam: Vagina is well healed a little more induration, recommend delay intercourse for 3 more weeks  Diagnostic Tests:   Pathology: Se previous report  Impression: S/p TAH BSO  Plan: Continue divigel, add estradiol 2 mg due to continued postmenopausal symptoms, and topicla estorgen for obvious vaginal atrophy Meds ordered this encounter  Medications  . estradiol (ESTRACE) 2 MG tablet    Sig: Take 1 tablet (2 mg total) by mouth daily.    Dispense:  30 tablet    Refill:  11  .  conjugated estrogens (PREMARIN) vaginal cream    Sig: Use 1 gram nightly    Dispense:  30 g    Refill:  12     Follow up: 1  years  Lazaro Arms, MD

## 2018-06-18 ENCOUNTER — Ambulatory Visit (HOSPITAL_COMMUNITY)
Admission: RE | Admit: 2018-06-18 | Discharge: 2018-06-18 | Disposition: A | Discharge status: Home / Self Care | Payer: No Typology Code available for payment source | Source: Ambulatory Visit | Attending: Family Medicine | Admitting: Family Medicine

## 2018-06-18 DIAGNOSIS — Z1231 Encounter for screening mammogram for malignant neoplasm of breast: Secondary | ICD-10-CM | POA: Insufficient documentation

## 2018-06-21 ENCOUNTER — Telehealth: Payer: Self-pay | Admitting: *Deleted

## 2018-06-21 NOTE — Telephone Encounter (Signed)
Could you send Premarin cream to [facility] in order for insurance to pay for it? Thanks.

## 2018-06-22 ENCOUNTER — Other Ambulatory Visit: Payer: Self-pay | Admitting: Obstetrics & Gynecology

## 2018-06-22 MED ORDER — ESTROGENS, CONJUGATED 0.625 MG/GM VA CREA: TOPICAL_CREAM | VAGINAL | 12 refills | Status: DC

## 2018-06-22 MED FILL — PREMARIN VAGINAL CREAM-APPL: 0.625 | 30 days supply | Qty: 30 | Fill #0

## 2018-06-22 NOTE — Telephone Encounter (Signed)
Ok done

## 2018-06-23 ENCOUNTER — Other Ambulatory Visit: Payer: Self-pay | Admitting: Obstetrics & Gynecology

## 2018-06-23 MED ORDER — ESTRADIOL 1 MG/GM TD GEL: 1.0000 | Freq: Every day | TRANSDERMAL | 11 refills | Status: DC

## 2018-06-23 MED ORDER — ESTROGENS, CONJUGATED 0.625 MG/GM VA CREA: TOPICAL_CREAM | VAGINAL | 12 refills | Status: DC

## 2018-06-23 MED ORDER — ESTRADIOL 2 MG PO TABS: 2.0000 mg | ORAL_TABLET | Freq: Every day | ORAL | 11 refills | Status: DC

## 2018-06-23 MED FILL — DIVIGEL 1 MG GEL PACKET: 1 | 30 days supply | Qty: 30 | Fill #0 | Status: TO

## 2018-08-25 MED FILL — DIVIGEL 1 MG GEL PACKET: 1 | 30 days supply | Qty: 30 | Fill #0

## 2018-09-20 ENCOUNTER — Other Ambulatory Visit: Payer: 59 | Admitting: Adult Health

## 2018-09-27 ENCOUNTER — Other Ambulatory Visit: Payer: Self-pay | Admitting: Adult Health

## 2018-10-15 ENCOUNTER — Emergency Department (HOSPITAL_COMMUNITY)
Admission: EM | Admit: 2018-10-15 | Discharge: 2018-10-15 | Disposition: A | Discharge status: Home / Self Care | Payer: No Typology Code available for payment source | Attending: Emergency Medicine | Admitting: Emergency Medicine

## 2018-10-15 ENCOUNTER — Emergency Department (HOSPITAL_COMMUNITY): Payer: No Typology Code available for payment source

## 2018-10-15 ENCOUNTER — Other Ambulatory Visit: Payer: Self-pay

## 2018-10-15 ENCOUNTER — Encounter (HOSPITAL_COMMUNITY): Payer: Self-pay | Admitting: Emergency Medicine

## 2018-10-15 DIAGNOSIS — G43109 Migraine with aura, not intractable, without status migrainosus: Secondary | ICD-10-CM | POA: Insufficient documentation

## 2018-10-15 DIAGNOSIS — R202 Paresthesia of skin: Secondary | ICD-10-CM | POA: Diagnosis not present

## 2018-10-15 LAB — DIFFERENTIAL
Abs Immature Granulocytes: 0.04 10*3/uL (ref 0.00–0.07)
Basophils Absolute: 0 10*3/uL (ref 0.0–0.1)
Basophils Relative: 0 %
Eosinophils Absolute: 0.2 10*3/uL (ref 0.0–0.5)
Eosinophils Relative: 2 %
Immature Granulocytes: 1 %
Lymphocytes Relative: 34 %
Lymphs Abs: 2.9 10*3/uL (ref 0.7–4.0)
Monocytes Absolute: 0.6 10*3/uL (ref 0.1–1.0)
Monocytes Relative: 7 %
Neutro Abs: 4.7 10*3/uL (ref 1.7–7.7)
Neutrophils Relative %: 56 %

## 2018-10-15 LAB — PROTIME-INR
INR: 1.1 (ref 0.8–1.2)
Prothrombin Time: 13.6 seconds (ref 11.4–15.2)

## 2018-10-15 LAB — CBC
HCT: 40.7 % (ref 36.0–46.0)
Hemoglobin: 13.2 g/dL (ref 12.0–15.0)
MCH: 30.4 pg (ref 26.0–34.0)
MCHC: 32.4 g/dL (ref 30.0–36.0)
MCV: 93.8 fL (ref 80.0–100.0)
Platelets: 461 10*3/uL — ABNORMAL HIGH (ref 150–400)
RBC: 4.34 MIL/uL (ref 3.87–5.11)
RDW: 12.4 % (ref 11.5–15.5)
WBC: 8.5 10*3/uL (ref 4.0–10.5)
nRBC: 0 % (ref 0.0–0.2)

## 2018-10-15 LAB — COMPREHENSIVE METABOLIC PANEL
ALT: 19 U/L (ref 0–44)
AST: 25 U/L (ref 15–41)
Albumin: 4.4 g/dL (ref 3.5–5.0)
Alkaline Phosphatase: 47 U/L (ref 38–126)
Anion gap: 9 (ref 5–15)
BUN: 21 mg/dL — ABNORMAL HIGH (ref 6–20)
CO2: 25 mmol/L (ref 22–32)
Calcium: 9.2 mg/dL (ref 8.9–10.3)
Chloride: 103 mmol/L (ref 98–111)
Creatinine, Ser: 0.99 mg/dL (ref 0.44–1.00)
GFR calc Af Amer: >60 mL/min (ref >60)
GFR calc non Af Amer: >60 mL/min (ref >60)
Glucose, Bld: 113 mg/dL — ABNORMAL HIGH (ref 70–99)
Potassium: 3.5 mmol/L (ref 3.5–5.1)
Sodium: 137 mmol/L (ref 135–145)
Total Bilirubin: 0.5 mg/dL (ref 0.3–1.2)
Total Protein: 7.9 g/dL (ref 6.5–8.1)

## 2018-10-15 LAB — ETHANOL: Alcohol, Ethyl (B): <10 mg/dL (ref <10)

## 2018-10-15 LAB — APTT: aPTT: 32 seconds (ref 24–36)

## 2018-10-15 MED ORDER — DIPHENHYDRAMINE HCL 50 MG/ML IJ SOLN
12.5000 mg | Freq: Once | INTRAMUSCULAR | Status: AC
  Administered 2018-10-15: 12.5 mg via INTRAVENOUS
  Filled 2018-10-15: qty 1

## 2018-10-15 MED ORDER — DEXAMETHASONE SODIUM PHOSPHATE 10 MG/ML IJ SOLN
10.0000 mg | Freq: Once | INTRAMUSCULAR | Status: AC
  Administered 2018-10-15: 10 mg via INTRAVENOUS
  Filled 2018-10-15: qty 1

## 2018-10-15 MED ORDER — PROCHLORPERAZINE EDISYLATE 10 MG/2ML IJ SOLN
10.0000 mg | Freq: Once | INTRAMUSCULAR | Status: AC
  Administered 2018-10-15: 21:00:00 10 mg via INTRAVENOUS
  Filled 2018-10-15: qty 2

## 2018-10-15 NOTE — ED Provider Notes (Signed)
[facility] EMERGENCY DEPARTMENT Provider Note   CSN: [id number] Arrival date & time: 10/15/18  1747    History   Chief Complaint Chief Complaint  Patient presents with   Code Stroke    HPI April Jordan is a 48 y.o. female with a history as outlined below, most significant for history of MVP with heart murmur, hypothyroidism and history of migraines reporting she had a generalized headache for most of today consistent with prior migraine headaches, but then developed numbness on her right side which started in her right dorsal foot around 4:30 PM, this numbness spread to her right forearm and now involves numbness in her right forehead as well.  She denies weakness in her extremities, also no dizziness, difficult speech or any focal neurologic deficits.  She initially thought the foot numbness was secondary to some problems she has been having with her lower back, but became concerned when it spread to her arm and her face.  She has had no treatments prior to arrival.  Her symptoms are not consistent with prior migraine episodes.     The history is provided by the patient.    Past Medical History:  Diagnosis Date   Abnormal Pap smear of cervix 06/22/2017   ASCH, -HPV, will get colpo   Complication of anesthesia    Heart murmur    Hypothyroidism    Mitral valve prolapse 1986   Mucous membrane pemphigoid 1999   Pemphigoid    PONV (postoperative nausea and vomiting)    Thyroid disease    hypothyroid   Vaginal Pap smear, abnormal     Patient Active Problem List   Diagnosis Date Noted   S/P TAH-BSO 05/12/2018   S/P TAH-BSO (total abdominal hysterectomy and bilateral salpingo-oophorectomy) 05/12/2018   History of abnormal cervical Pap smear 03/22/2018   S/P laser of cervix 03/22/2018   Dyspareunia in female 03/22/2018   Hot flashes 03/22/2018   Abnormal Pap smear of cervix 06/22/2017   Thyroid nodule 06/16/2017   Encounter for gynecological  examination with Papanicolaou smear of cervix 06/16/2017   Unspecified hypothyroidism 03/10/2013    Past Surgical History:  Procedure Laterality Date   ABDOMINAL HYSTERECTOMY     ABDOMINAL HYSTERECTOMY N/A 05/12/2018   Procedure: HYSTERECTOMY ABDOMINAL;  Surgeon: Florian Buff, MD;  Location: [facility];  Service: Gynecology;  Laterality: N/A;   CERVICAL CONIZATION W/BX N/A 08/12/2017   Procedure: LASER CONIZATION CERVIX WITH BIOPSY;  Surgeon: Florian Buff, MD;  Location: [facility];  Service: Gynecology;  Laterality: N/A;   CERVICAL DISC SURGERY     CHOLECYSTECTOMY N/A 07/13/2013   Procedure: LAPAROSCOPIC CHOLECYSTECTOMY;  Surgeon: Jamesetta So, MD;  Location: [facility];  Service: General;  Laterality: N/A;   COLPOSCOPY     SALPINGOOPHORECTOMY Bilateral 05/12/2018   Procedure: BILATERAL SALPINGO OOPHORECTOMY;  Surgeon: Florian Buff, MD;  Location: [facility];  Service: Gynecology;  Laterality: Bilateral;   TUBAL LIGATION     uterine ablation       OB History    Gravida  1   Para  1   Term  1   Preterm      AB      Living  1     SAB      TAB      Ectopic      Multiple      Live Births               Home Medications    Prior  to Admission medications   Medication Sig Start Date End Date Taking? Authorizing Provider  acetaminophen (TYLENOL) 500 MG tablet Take 1,000 mg by mouth every 6 (six) hours as needed for moderate pain or headache.   Yes [provider]  aspirin-acetaminophen-caffeine (EXCEDRIN MIGRAINE) [id number] MG per tablet Take 2 tablets by mouth every 6 (six) hours as needed for headache.   Yes [provider]  conjugated estrogens (PREMARIN) vaginal cream Use 1 gram nightly 06/23/18  Yes Lazaro ArmsEure, Luther H, MD  Estradiol (DIVIGEL) 1 MG/GM GEL Place 1 packet onto the skin daily. 06/23/18  Yes Lazaro ArmsEure, Luther H, MD  estradiol (ESTRACE) 2 MG tablet Take 1 tablet (2 mg total) by mouth daily. 06/23/18  Yes Lazaro ArmsEure, Luther H, MD  fluticasone  (FLONASE) 50 MCG/ACT nasal spray Place 1 spray into both nostrils daily.   Yes [provider]  levothyroxine (SYNTHROID, LEVOTHROID) 150 MCG tablet Take 150 mcg by mouth daily before breakfast.   Yes [provider]  Peppermint Oil (IBGARD PO) Take 1 capsule by mouth daily.   Yes [provider]  triamcinolone cream (KENALOG) 0.1 % Apply 1 application topically 2 (two) times daily as needed (for skin irritation/rash).   Yes [provider]    Family History Family History  Problem Relation Age of Onset   Hypertension Mother    Hypertension Father    Hypertension Paternal Grandmother     Social History Social History   Tobacco Use   Smoking status: Never Smoker   Smokeless tobacco: Never Used  Substance Use Topics   Alcohol use: Yes    Comment: socially   Drug use: No     Allergies   Amoxicillin, Chlorhexidine gluconate, Ciprofloxacin, Levaquin [levofloxacin], and Sulfa antibiotics   Review of Systems Review of Systems  Constitutional: Negative for fever.  HENT: Negative for congestion and sore throat.   Eyes: Negative.   Respiratory: Negative for chest tightness and shortness of breath.   Cardiovascular: Negative for chest pain and palpitations.  Gastrointestinal: Negative for abdominal pain and nausea.  Genitourinary: Negative.   Musculoskeletal: Negative for arthralgias, joint swelling and neck pain.  Skin: Negative.  Negative for rash and wound.  Neurological: Positive for numbness and headaches. Negative for dizziness, syncope, speech difficulty, weakness and light-headedness.  Psychiatric/Behavioral: Negative.      Physical Exam Updated Vital Signs BP (!) 132/95    Pulse 60    Temp 98.3 F (36.8 C) (Oral)    Resp 13    Ht 5\' 2"  (1.575 m)    Wt 71.6 kg    LMP  (LMP Unknown)    SpO2 100%    BMI 28.88 kg/m   Physical Exam Vitals signs and nursing note reviewed.  Constitutional:      Appearance: She is  well-developed.  HENT:     Head: Normocephalic and atraumatic.  Eyes:     Conjunctiva/sclera: Conjunctivae normal.  Neck:     Musculoskeletal: Normal range of motion.  Cardiovascular:     Rate and Rhythm: Normal rate and regular rhythm.     Heart sounds: Normal heart sounds.  Pulmonary:     Effort: Pulmonary effort is normal.     Breath sounds: Normal breath sounds. No wheezing.  Abdominal:     General: Bowel sounds are normal.     Palpations: Abdomen is soft.     Tenderness: There is no abdominal tenderness.  Musculoskeletal: Normal range of motion.  Skin:    General: Skin is warm and  dry.  Neurological:     Mental Status: She is alert.      ED Treatments / Results  Labs (all labs ordered are listed, but only abnormal results are displayed) Labs Reviewed  CBC - Abnormal; Notable for the following components:      Result Value   Platelets 461 (*)    All other components within normal limits  COMPREHENSIVE METABOLIC PANEL - Abnormal; Notable for the following components:   Glucose, Bld 113 (*)    BUN 21 (*)    All other components within normal limits  ETHANOL  PROTIME-INR  APTT  DIFFERENTIAL    EKG EKG Interpretation  Date/Time:  Friday October 15 2018 18:10:29 EDT Ventricular Rate:  77 PR Interval:    QRS Duration: 99 QT Interval:  366 QTC Calculation: 415 R Axis:   78 Text Interpretation:  Sinus rhythm Confirmed by Vanetta MuldersZackowski, Scott [phone]([id number]) on 10/15/2018 6:54:01 PM   Radiology Mr Brain Wo Contrast (neuro Protocol)  Result Date: 10/15/2018 CLINICAL DATA:  RIGHT-sided weakness began in the foot and ascended. EXAM: MRI HEAD WITHOUT CONTRAST TECHNIQUE: Multiplanar, multiecho pulse sequences of the brain and surrounding structures were obtained without intravenous contrast. COMPARISON:  Code stroke CT earlier today was negative. FINDINGS: Brain: No evidence for acute infarction, hemorrhage, mass lesion, hydrocephalus, or extra-axial fluid. Normal cerebral volume.  No white matter disease. Vascular: Flow voids are maintained throughout the carotid, basilar, and vertebral arteries. There are no areas of chronic hemorrhage. Skull and upper cervical spine: Unremarkable visualized calvarium, skullbase, and cervical vertebrae. Pituitary, pineal, cerebellar tonsils unremarkable. No upper cervical cord lesions. Sinuses/Orbits: No orbital masses or proptosis. Globes appear symmetric. Sinuses appear well aerated, without evidence for air-fluid level. Other: No nasopharyngeal pathology or mastoid fluid. Scalp and other visualized extracranial soft tissues grossly unremarkable. IMPRESSION: Negative exam. No acute intracranial findings. Specifically, no acute stroke. Electronically Signed   By: Elsie StainJohn T Curnes M.D.   On: 10/15/2018 19:23   Ct Head Code Stroke Wo Contrast`  Result Date: 10/15/2018 CLINICAL DATA:  Code stroke. RIGHT hand, arm, and foot numbness began earlier today. EXAM: CT HEAD WITHOUT CONTRAST TECHNIQUE: Contiguous axial images were obtained from the base of the skull through the vertex without intravenous contrast. COMPARISON:  None. FINDINGS: Brain: No evidence of acute infarction, hemorrhage, hydrocephalus, extra-axial collection or mass lesion/mass effect. Normal for age cerebral volume. No white matter disease. Vascular: No hyperdense vessel or unexpected calcification. Skull: Normal. Negative for fracture or focal lesion. Sinuses/Orbits: No acute finding. Other: Slight tonsillar ectopia, no Chiari I malformation or cervicomedullary compression. ASPECTS [facility]([place]ta Stroke Program Early CT Score) - Ganglionic level infarction (caudate, lentiform nuclei, internal capsule, insula, M1-M3 cortex): 7 - Supraganglionic infarction (M4-M6 cortex): 3 Total score (0-10 with 10 being normal): 10 IMPRESSION: 1. Negative exam. 2. ASPECTS is 10. 3. These results were called by telephone at the time of interpretation on 10/15/2018 at 6:04 pm to Dr. Vanetta MuldersSCOTT ZACKOWSKI , who verbally  acknowledged these results. Electronically Signed   By: Elsie StainJohn T Curnes M.D.   On: 10/15/2018 18:09    Procedures Procedures (including critical care time)  Medications Ordered in ED Medications  dexamethasone (DECADRON) injection 10 mg (10 mg Intravenous Given 10/15/18 2030)  diphenhydrAMINE (BENADRYL) injection 12.5 mg (12.5 mg Intravenous Given 10/15/18 2030)  prochlorperazine (COMPAZINE) injection 10 mg (10 mg Intravenous Given 10/15/18 2034)     Initial Impression / Assessment and Plan / ED Course  I have reviewed the triage vital signs and the nursing  notes.  Pertinent labs & imaging results that were available during my care of the patient were reviewed by me and considered in my medical decision making (see chart for details).        Pt has a history of migraine headache, at times will be accompanied by left hand numbness, todays event atypical in location and spread of numbness, but no weakness or dysarthria, confusion or focal weakness.  Neurology evaluation recommended admission for complete cva w/u although also concurred suspicion of complicated migraine.  Pt does not wish to stay and was sx free by time of dc.  She was given strict return precautions for any return of or worsened sx.  Pt understands and agrees with plan.   Final Clinical Impressions(s) / ED Diagnoses   Final diagnoses:  Complicated migraine    ED Discharge Orders    None       Victoriano Laindol, Helios Kohlmann, PA-C 10/15/18 2103    Vanetta MuldersZackowski, Scott, MD 10/16/18 [phone]

## 2018-10-15 NOTE — ED Notes (Signed)
To MRI

## 2018-10-15 NOTE — Discharge Instructions (Addendum)
As discussed your lab tests, CT scan and MRI tonight are negative for any evidence of an acute stroke.  We suspect that this is a complicated migraine headache.  We discussed admitting you to the hospital tonight at the neurologist recommendation, but I understand you wish to not stay in the hospital.  Please return immediately if you have any return of or worsening symptoms.

## 2018-10-15 NOTE — Progress Notes (Signed)
Code Stroke Times:    2482 NOIBBC  4888 BVQXIH 0388 Exam started 1751 Exam finished, images sent 8280 [place] called  1754 completed in epic

## 2018-10-15 NOTE — Consult Note (Signed)
[facility] Tele[facility]urology Consult Services   Date of Service:   10/15/2018 18:07:09  Impression:     .  Rule Out Acute Ischemic Stroke  Comments/Sign-Out: 54 F, here with R sided numbness, likely in setting of atypical migraine. NIHSS 1, not a TPA candidate due to mildness and resolving sx. She should be adm for stroke wu.  PLAN  - MRI brain w/o contrast  - MRA head w/o contrast, MRA neck w/ and w/o contrast  - TTE w/bubble  - monitor on tele for afib  - check a1c and LDL  - neuro to follow  ---  Metrics: Last Known Well: 10/15/2018 16:15:00 [facility] Notification Time: 10/15/2018 18:07:09 Arrival Time: 10/15/2018 17:47:00 Stamp Time: 10/15/2018 18:07:09 Time First Login Attempt: 10/15/2018 18:11:57 Video Start Time: 10/15/2018 18:11:57  Symptoms: right sided numbness NIHSS Start Assessment Time: 10/15/2018 18:14:34 Patient is not a candidate for tPA. Patient was not deemed candidate for tPA thrombolytics because of Resolved symptoms (no residual disabling symptoms). Video End Time: 10/15/2018 18:25:13  CT head showed no acute hemorrhage or acute core infarct.  Clinical Presentation is not Suggestive of Large Vessel Occlusive Disease  ED Physician notified of diagnostic impression and management plan on 10/15/2018 18:24:31  Sign Out:     .  Discussed with Emergency Department Provider  ------------------------------------------------------------------------------  History of Present Illness: Patient is a 48 year old Female.  Patient was brought by private transportation with symptoms of right sided numbness  75 F, h/o migraine with aura and hypothyroidism, LKW 4:15 PM today, developed acute onset R foot numbness. Then around 5 PM, this involved the R hand as well, followed later by R forehead. She came to ED for eval. At time of my assessment, NIHSS 1 for subtle R paresthesias of hand and sole of foot. No weakness, cognitive deficits, or  other issues. Of note, pt has history of migraine with visual aura. She woke up with a slight headache this morning, which is still there, characterized as a dull discomfort over her R forehead. Denies history of stroke or TIA. CT head negative for acute abnl.   Examination: 1A: Level of Consciousness - Alert; keenly responsive + 0 1B: Ask Month and Age - Both Questions Right + 0 1C: Blink Eyes & Squeeze Hands - Performs Both Tasks + 0 2: Test Horizontal Extraocular Movements - Normal + 0 3: Test Visual Fields - No Visual Loss + 0 4: Test Facial Palsy (Use Grimace if Obtunded) - Normal symmetry + 0 5A: Test Left Arm Motor Drift - No Drift for 10 Seconds + 0 5B: Test Right Arm Motor Drift - No Drift for 10 Seconds + 0 6A: Test Left Leg Motor Drift - No Drift for 5 Seconds + 0 6B: Test Right Leg Motor Drift - No Drift for 5 Seconds + 0 7: Test Limb Ataxia (FNF/Heel-Shin) - No Ataxia + 0 8: Test Sensation - Mild-Moderate Loss: Less Sharp/More Dull + 1 9: Test Language/Aphasia - Normal; No aphasia + 0 10: Test Dysarthria - Normal + 0 11: Test Extinction/Inattention - No abnormality + 0  NIHSS Score: 1  Patient/Family was informed the Neurology Consult would happen via TeleHealth consult by way of interactive audio and video telecommunications and consented to receiving care in this manner.  Due to the immediate potential for life-threatening deterioration due to underlying acute neurologic illness, I spent 10 minutes providing critical care. This time includes time for face to face visit via telemedicine, review of medical records, imaging  studies and discussion of findings with providers, the patient and/or family.   Dr Othelia PullingSui Surya Schroeter   [facility] [phone]   Case [id number]

## 2018-10-15 NOTE — ED Provider Notes (Signed)
Medical screening examination/treatment/procedure(s) were conducted as a shared visit with non-physician practitioner(s) and myself.  I personally evaluated the patient during the encounter.  EKG Interpretation  Date/Time:  Friday October 15 2018 18:10:29 EDT Ventricular Rate:  77 PR Interval:    QRS Duration: 99 QT Interval:  366 QTC Calculation: 415 R Axis:   78 Text Interpretation:  Sinus rhythm Confirmed by Fredia Sorrow [id number]) on 10/15/2018 6:54:01 PM   Results for orders placed or performed during the hospital encounter of 10/15/18  Ethanol  Result Value Ref Range   Alcohol, Ethyl (B) <10 <10 mg/dL  Protime-INR  Result Value Ref Range   Prothrombin Time 13.6 11.4 - 15.2 seconds   INR 1.1 0.8 - 1.2  APTT  Result Value Ref Range   aPTT 32 24 - 36 seconds  CBC  Result Value Ref Range   WBC 8.5 4.0 - 10.5 K/uL   RBC 4.34 3.87 - 5.11 MIL/uL   Hemoglobin 13.2 12.0 - 15.0 g/dL   HCT 40.7 36.0 - 46.0 %   MCV 93.8 80.0 - 100.0 fL   MCH 30.4 26.0 - 34.0 pg   MCHC 32.4 30.0 - 36.0 g/dL   RDW 12.4 11.5 - 15.5 %   Platelets 461 (H) 150 - 400 K/uL   nRBC 0.0 0.0 - 0.2 %  Differential  Result Value Ref Range   Neutrophils Relative % 56 %   Neutro Abs 4.7 1.7 - 7.7 K/uL   Lymphocytes Relative 34 %   Lymphs Abs 2.9 0.7 - 4.0 K/uL   Monocytes Relative 7 %   Monocytes Absolute 0.6 0.1 - 1.0 K/uL   Eosinophils Relative 2 %   Eosinophils Absolute 0.2 0.0 - 0.5 K/uL   Basophils Relative 0 %   Basophils Absolute 0.0 0.0 - 0.1 K/uL   Immature Granulocytes 1 %   Abs Immature Granulocytes 0.04 0.00 - 0.07 K/uL  Comprehensive metabolic panel  Result Value Ref Range   Sodium 137 135 - 145 mmol/L   Potassium 3.5 3.5 - 5.1 mmol/L   Chloride 103 98 - 111 mmol/L   CO2 25 22 - 32 mmol/L   Glucose, Bld 113 (H) 70 - 99 mg/dL   BUN 21 (H) 6 - 20 mg/dL   Creatinine, Ser 0.99 0.44 - 1.00 mg/dL   Calcium 9.2 8.9 - 10.3 mg/dL   Total Protein 7.9 6.5 - 8.1 g/dL   Albumin 4.4 3.5 - 5.0 g/dL    AST 25 15 - 41 U/L   ALT 19 0 - 44 U/L   Alkaline Phosphatase 47 38 - 126 U/L   Total Bilirubin 0.5 0.3 - 1.2 mg/dL   GFR calc non Af Amer >60 >60 mL/min   GFR calc Af Amer >60 >60 mL/min   Anion gap 9 5 - 15   Mr Brain Wo Contrast (neuro Protocol)  Result Date: 10/15/2018 CLINICAL DATA:  RIGHT-sided weakness began in the foot and ascended. EXAM: MRI HEAD WITHOUT CONTRAST TECHNIQUE: Multiplanar, multiecho pulse sequences of the brain and surrounding structures were obtained without intravenous contrast. COMPARISON:  Code stroke CT earlier today was negative. FINDINGS: Brain: No evidence for acute infarction, hemorrhage, mass lesion, hydrocephalus, or extra-axial fluid. Normal cerebral volume. No white matter disease. Vascular: Flow voids are maintained throughout the carotid, basilar, and vertebral arteries. There are no areas of chronic hemorrhage. Skull and upper cervical spine: Unremarkable visualized calvarium, skullbase, and cervical vertebrae. Pituitary, pineal, cerebellar tonsils unremarkable. No upper cervical cord lesions. Sinuses/Orbits: No  orbital masses or proptosis. Globes appear symmetric. Sinuses appear well aerated, without evidence for air-fluid level. Other: No nasopharyngeal pathology or mastoid fluid. Scalp and other visualized extracranial soft tissues grossly unremarkable. IMPRESSION: Negative exam. No acute intracranial findings. Specifically, no acute stroke. Electronically Signed   By: Elsie StainJohn T Curnes M.D.   On: 10/15/2018 19:23   Ct Head Code Stroke Wo Contrast`  Result Date: 10/15/2018 CLINICAL DATA:  Code stroke. RIGHT hand, arm, and foot numbness began earlier today. EXAM: CT HEAD WITHOUT CONTRAST TECHNIQUE: Contiguous axial images were obtained from the base of the skull through the vertex without intravenous contrast. COMPARISON:  None. FINDINGS: Brain: No evidence of acute infarction, hemorrhage, hydrocephalus, extra-axial collection or mass lesion/mass effect. Normal  for age cerebral volume. No white matter disease. Vascular: No hyperdense vessel or unexpected calcification. Skull: Normal. Negative for fracture or focal lesion. Sinuses/Orbits: No acute finding. Other: Slight tonsillar ectopia, no Chiari I malformation or cervicomedullary compression. ASPECTS [facility]([place] Stroke Program Early CT Score) - Ganglionic level infarction (caudate, lentiform nuclei, internal capsule, insula, M1-M3 cortex): 7 - Supraganglionic infarction (M4-M6 cortex): 3 Total score (0-10 with 10 being normal): 10 IMPRESSION: 1. Negative exam. 2. ASPECTS is 10. 3. These results were called by telephone at the time of interpretation on 10/15/2018 at 6:04 pm to Dr. Vanetta MuldersSCOTT Aaren [place] , who verbally acknowledged these results. Electronically Signed   By: Elsie StainJohn T Curnes M.D.   On: 10/15/2018 18:09     Patient has a known history of migraines.  Patient's had a headache.  Still has a headache.  Not severe.  She started having right-sided numbness to right arm right lower extremity.  Patient seen by tele-neurology.  Recommended MRI head CT was negative MRI was negative.  They did also recommend admission patient's refusing admission she feels that it is related to her headaches.  Would make this a complicated migraine.  Patient did not want GI cocktail.  Patient clinically looking very good will not force her to sign out AMA.  Will allow her to go home she will return for any new or worse symptoms.  Clinically with a negative MRI of very reassuring I feel patient probably does have a complicated migraine.   Vanetta MuldersZackowski, Jacyln Carmer, MD 10/15/18 2059

## 2018-10-15 NOTE — ED Triage Notes (Signed)
Pt states that she started having right side weakness started in her right foot moving up

## 2018-10-18 MED FILL — PREMARIN VAGINAL CREAM-APPL: 0.625 | 30 days supply | Qty: 30 | Fill #0

## 2018-10-18 MED FILL — DIVIGEL 1 MG GEL PACKET: 1 | 30 days supply | Qty: 30 | Fill #1

## 2018-10-21 ENCOUNTER — Ambulatory Visit (INDEPENDENT_AMBULATORY_CARE_PROVIDER_SITE_OTHER): Payer: No Typology Code available for payment source | Admitting: Adult Health

## 2018-10-21 ENCOUNTER — Encounter: Payer: Self-pay | Admitting: Adult Health

## 2018-10-21 ENCOUNTER — Other Ambulatory Visit: Payer: Self-pay

## 2018-10-21 VITALS — BP 117/76 | HR 66 | Ht 62.0 in | Wt 151.0 lb

## 2018-10-21 DIAGNOSIS — Z1212 Encounter for screening for malignant neoplasm of rectum: Secondary | ICD-10-CM | POA: Diagnosis not present

## 2018-10-21 DIAGNOSIS — Z1211 Encounter for screening for malignant neoplasm of colon: Secondary | ICD-10-CM | POA: Insufficient documentation

## 2018-10-21 DIAGNOSIS — Z0000 Encounter for general adult medical examination without abnormal findings: Secondary | ICD-10-CM | POA: Diagnosis not present

## 2018-10-21 LAB — HEMOCCULT GUIAC POC 1CARD (OFFICE): Fecal Occult Blood, POC: NEGATIVE

## 2018-10-21 NOTE — Progress Notes (Signed)
Patient ID: ZILPHA MCANDREW, female   DOB: [birth year], 48 y.o.   MRN: [id number] History of Present Illness: April Jordan is a 48 year old white female, married, sp hysterectomy January 2020, in for a physical. She has had several migraines recently, and wonders if related to estrogen.Has pain sex at times, but hot flashes are much better. PCP is Dr Nadara Mustard.    Current Medications, Allergies, Past Medical History, Past Surgical History, Family History and Social History were reviewed in [employer] record.     Review of Systems: Patient denies any  hearing loss, fatigue, blurred vision, shortness of breath, chest pain, abdominal pain, problems with bowel movements, or urination.  No joint pain or mood swings. See HPI for positives.   Physical Exam:BP 117/76 (BP Location: Right Arm, Patient Position: Sitting, Cuff Size: Normal)   Pulse 66   Ht 5\' 2"  (1.575 m)   Wt 151 lb (68.5 kg)   LMP  (LMP Unknown)   BMI 27.62 kg/m  General:  Well developed, well nourished, no acute distress Skin:  Warm and dry,tan Neck:  Midline trachea, normal thyroid, good ROM, no lymphadenopathy Lungs; Clear to auscultation bilaterally Breast:  No dominant palpable mass, retraction, or nipple discharge Cardiovascular: Regular rate and rhythm Abdomen:  Soft, non tender, no hepatosplenomegaly Pelvic:  External genitalia is normal in appearance, no lesions.  The vagina is normal in appearance, has good color and moisture. Urethra has no lesions or masses. The cervix and uterus are absent..  No adnexal masses or tenderness noted.Bladder is non tender, no masses felt. Rectal: Good sphincter tone, no polyps, or hemorrhoids felt.  Hemoccult negative. Extremities/musculoskeletal:  No swelling or varicosities noted, no clubbing or cyanosis Psych:  No mood changes, alert and cooperative,seems happy PHQ 2 score 0. Examination chaperoned by Alice Rieger RN Discussed continuing PVC 2-3 x weekly and  stopping divigel for now and taking PO estrace 2 mg and see if that helps.   Impression: 1. Physical exam   2. Screening for colorectal cancer       Plan: Pap in February Physical in 1 year Mammogram yearly  Labs with PCP

## 2019-05-02 ENCOUNTER — Telehealth (HOSPITAL_COMMUNITY): Payer: Self-pay | Admitting: Physical Therapy

## 2019-05-02 ENCOUNTER — Ambulatory Visit (HOSPITAL_COMMUNITY): Payer: No Typology Code available for payment source | Admitting: Physical Therapy

## 2019-05-02 NOTE — Telephone Encounter (Signed)
Pt called to cx l/m - called her back to r/s she did not want to r/s at this time. She will call us back if she wants to r/s within the next 30 days.

## 2019-05-14 ENCOUNTER — Other Ambulatory Visit: Payer: Self-pay | Admitting: Obstetrics & Gynecology

## 2019-06-10 ENCOUNTER — Other Ambulatory Visit (HOSPITAL_COMMUNITY): Payer: Self-pay | Admitting: Family Medicine

## 2019-06-10 DIAGNOSIS — Z1231 Encounter for screening mammogram for malignant neoplasm of breast: Secondary | ICD-10-CM

## 2019-06-29 ENCOUNTER — Other Ambulatory Visit: Payer: Self-pay

## 2019-06-29 ENCOUNTER — Ambulatory Visit (HOSPITAL_COMMUNITY)
Admission: RE | Admit: 2019-06-29 | Discharge: 2019-06-29 | Disposition: A | Discharge status: Home / Self Care | Payer: No Typology Code available for payment source | Source: Ambulatory Visit | Attending: Family Medicine | Admitting: Family Medicine

## 2019-06-29 DIAGNOSIS — Z1231 Encounter for screening mammogram for malignant neoplasm of breast: Secondary | ICD-10-CM | POA: Diagnosis not present

## 2019-12-29 ENCOUNTER — Telehealth: Payer: No Typology Code available for payment source | Admitting: Physician Assistant

## 2019-12-29 DIAGNOSIS — M545 Low back pain, unspecified: Secondary | ICD-10-CM

## 2019-12-29 MED ORDER — CYCLOBENZAPRINE HCL 10 MG PO TABS: 10.0000 mg | ORAL_TABLET | Freq: Three times a day (TID) | ORAL | 0 refills | Status: AC | PRN

## 2019-12-29 MED ORDER — PREDNISONE 20 MG PO TABS: ORAL_TABLET | ORAL | 0 refills | Status: DC

## 2019-12-29 MED ORDER — NAPROXEN 500 MG PO TABS: 500.0000 mg | ORAL_TABLET | Freq: Two times a day (BID) | ORAL | 0 refills | Status: AC

## 2019-12-29 NOTE — Progress Notes (Signed)
Repeat e-visit for low back pain.  Pt requesting steroids in addition to flexeril and naprosyn.  These were sent with the last e-visit.

## 2019-12-29 NOTE — Addendum Note (Signed)
Addended by: Dierdre Forth on: 12/29/2019 09:25 AM   Modules accepted: Orders

## 2019-12-29 NOTE — Progress Notes (Signed)

## 2020-07-07 ENCOUNTER — Other Ambulatory Visit: Payer: Self-pay | Admitting: Obstetrics & Gynecology

## 2020-08-31 ENCOUNTER — Other Ambulatory Visit (HOSPITAL_COMMUNITY): Payer: Self-pay | Admitting: Obstetrics & Gynecology

## 2020-08-31 DIAGNOSIS — Z1231 Encounter for screening mammogram for malignant neoplasm of breast: Secondary | ICD-10-CM

## 2020-09-05 ENCOUNTER — Ambulatory Visit (HOSPITAL_COMMUNITY)
Admission: RE | Admit: 2020-09-05 | Discharge: 2020-09-05 | Disposition: A | Discharge status: Home / Self Care | Payer: No Typology Code available for payment source | Source: Ambulatory Visit | Attending: Obstetrics & Gynecology | Admitting: Obstetrics & Gynecology

## 2020-09-05 DIAGNOSIS — Z1231 Encounter for screening mammogram for malignant neoplasm of breast: Secondary | ICD-10-CM | POA: Insufficient documentation

## 2020-09-10 ENCOUNTER — Encounter (HOSPITAL_COMMUNITY): Payer: Self-pay

## 2020-09-10 ENCOUNTER — Other Ambulatory Visit: Payer: Self-pay

## 2020-09-10 ENCOUNTER — Emergency Department (HOSPITAL_COMMUNITY)
Admission: EM | Admit: 2020-09-10 | Discharge: 2020-09-10 | Disposition: A | Discharge status: Home / Self Care | Payer: No Typology Code available for payment source | Attending: Emergency Medicine | Admitting: Emergency Medicine

## 2020-09-10 ENCOUNTER — Emergency Department (HOSPITAL_COMMUNITY): Payer: No Typology Code available for payment source

## 2020-09-10 DIAGNOSIS — H748X3 Other specified disorders of middle ear and mastoid, bilateral: Secondary | ICD-10-CM | POA: Diagnosis not present

## 2020-09-10 DIAGNOSIS — R110 Nausea: Secondary | ICD-10-CM | POA: Insufficient documentation

## 2020-09-10 DIAGNOSIS — R42 Dizziness and giddiness: Secondary | ICD-10-CM | POA: Diagnosis not present

## 2020-09-10 DIAGNOSIS — Z79899 Other long term (current) drug therapy: Secondary | ICD-10-CM | POA: Insufficient documentation

## 2020-09-10 DIAGNOSIS — E039 Hypothyroidism, unspecified: Secondary | ICD-10-CM | POA: Diagnosis not present

## 2020-09-10 LAB — CBC WITH DIFFERENTIAL/PLATELET
Abs Immature Granulocytes: 0.03 10*3/uL (ref 0.00–0.07)
Basophils Absolute: 0 10*3/uL (ref 0.0–0.1)
Basophils Relative: 1 %
Eosinophils Absolute: 0.1 10*3/uL (ref 0.0–0.5)
Eosinophils Relative: 2 %
HCT: 43.8 % (ref 36.0–46.0)
Hemoglobin: 14.1 g/dL (ref 12.0–15.0)
Immature Granulocytes: 1 %
Lymphocytes Relative: 27 %
Lymphs Abs: 1.7 10*3/uL (ref 0.7–4.0)
MCH: 30.5 pg (ref 26.0–34.0)
MCHC: 32.2 g/dL (ref 30.0–36.0)
MCV: 94.8 fL (ref 80.0–100.0)
Monocytes Absolute: 0.6 10*3/uL (ref 0.1–1.0)
Monocytes Relative: 9 %
Neutro Abs: 3.9 10*3/uL (ref 1.7–7.7)
Neutrophils Relative %: 60 %
Platelets: 424 10*3/uL — ABNORMAL HIGH (ref 150–400)
RBC: 4.62 MIL/uL (ref 3.87–5.11)
RDW: 13.1 % (ref 11.5–15.5)
WBC: 6.3 10*3/uL (ref 4.0–10.5)
nRBC: 0 % (ref 0.0–0.2)

## 2020-09-10 LAB — COMPREHENSIVE METABOLIC PANEL
ALT: 26 U/L (ref 0–44)
AST: 28 U/L (ref 15–41)
Albumin: 4.1 g/dL (ref 3.5–5.0)
Alkaline Phosphatase: 46 U/L (ref 38–126)
Anion gap: 7 (ref 5–15)
BUN: 21 mg/dL — ABNORMAL HIGH (ref 6–20)
CO2: 27 mmol/L (ref 22–32)
Calcium: 9.2 mg/dL (ref 8.9–10.3)
Chloride: 103 mmol/L (ref 98–111)
Creatinine, Ser: 1.04 mg/dL — ABNORMAL HIGH (ref 0.44–1.00)
GFR, Estimated: >60 mL/min (ref >60)
Glucose, Bld: 95 mg/dL (ref 70–99)
Potassium: 4.2 mmol/L (ref 3.5–5.1)
Sodium: 137 mmol/L (ref 135–145)
Total Bilirubin: 0.5 mg/dL (ref 0.3–1.2)
Total Protein: 7.8 g/dL (ref 6.5–8.1)

## 2020-09-10 MED ORDER — MECLIZINE HCL 12.5 MG PO TABS
25.0000 mg | ORAL_TABLET | Freq: Once | ORAL | Status: AC
  Administered 2020-09-10: 25 mg via ORAL
  Filled 2020-09-10: qty 2

## 2020-09-10 MED ORDER — LORAZEPAM 1 MG PO TABS: 1.0000 mg | ORAL_TABLET | Freq: Three times a day (TID) | ORAL | 0 refills | Status: DC | PRN

## 2020-09-10 MED ORDER — LORAZEPAM 2 MG/ML IJ SOLN
1.0000 mg | Freq: Once | INTRAMUSCULAR | Status: AC
  Administered 2020-09-10: 1 mg via INTRAVENOUS
  Filled 2020-09-10: qty 1

## 2020-09-10 NOTE — ED Provider Notes (Signed)
[facility] EMERGENCY DEPARTMENT Provider Note   CSN: [id number] Arrival date & time: 09/10/20  0086     History Chief Complaint  Patient presents with  . Dizziness    April Jordan is a 50 y.o. female.  HPI Patient is a 50 year old female with a medical history as noted below.  She states that she woke up around 0200, 3 days ago with dizziness.  No history of similar symptoms.  No alcohol use the night before.  She states that her dizziness has been persistent for the past 3 days.  It starts and worsens with any head movement.  Describes it as the room spinning.  She has been taking meclizine with no relief.  Last dose of meclizine was yesterday.  She has been performing the Epley maneuver as well with no relief.  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  Denies any numbness, weakness, chest pain, shortness of breath, vomiting, ear pain, ear discharge.  She does report mild nausea that occurs when her dizziness worsens.   Also notes a history of seasonal allergies and takes Flonase as well as Xyzal.  No ear pain or ear discharge.  No recent illnesses. .   Past Medical History:  Diagnosis Date  . Abnormal Pap smear of cervix 06/22/2017   ASCH, -HPV, will get colpo  . Complication of anesthesia   . Heart murmur   . Hypothyroidism   . Mitral valve prolapse 1986  . Mucous membrane pemphigoid 1999  . Pemphigoid   . PONV (postoperative nausea and vomiting)   . Thyroid disease    hypothyroid  . Vaginal Pap smear, abnormal     Patient Active Problem List   Diagnosis Date Noted  . Physical exam 10/21/2018  . Screening for colorectal cancer 10/21/2018  . S/P TAH-BSO 05/12/2018  . S/P TAH-BSO (total abdominal hysterectomy and bilateral salpingo-oophorectomy) 05/12/2018  . History of abnormal cervical Pap smear 03/22/2018  . S/P laser of cervix 03/22/2018  . Dyspareunia in female 03/22/2018  . Hot flashes 03/22/2018  .  Abnormal Pap smear of cervix 06/22/2017  . Thyroid nodule 06/16/2017  . Encounter for gynecological examination with Papanicolaou smear of cervix 06/16/2017  . Unspecified hypothyroidism 03/10/2013    Past Surgical History:  Procedure Laterality Date  . ABDOMINAL HYSTERECTOMY    . ABDOMINAL HYSTERECTOMY N/A 05/12/2018   Procedure: HYSTERECTOMY ABDOMINAL;  Surgeon: Lazaro Arms, MD;  Location: [facility];  Service: Gynecology;  Laterality: N/A;  . CERVICAL CONIZATION W/BX N/A 08/12/2017   Procedure: LASER CONIZATION CERVIX WITH BIOPSY;  Surgeon: Lazaro Arms, MD;  Location: [facility];  Service: Gynecology;  Laterality: N/A;  . CERVICAL DISC SURGERY    . CHOLECYSTECTOMY N/A 07/13/2013   Procedure: LAPAROSCOPIC CHOLECYSTECTOMY;  Surgeon: Dalia Heading, MD;  Location: [facility];  Service: General;  Laterality: N/A;  . COLPOSCOPY    . SALPINGOOPHORECTOMY Bilateral 05/12/2018   Procedure: BILATERAL SALPINGO OOPHORECTOMY;  Surgeon: Lazaro Arms, MD;  Location: [facility];  Service: Gynecology;  Laterality: Bilateral;  . TUBAL LIGATION    . uterine ablation       OB History    Gravida  1   Para  1   Term  1   Preterm      AB      Living  1     SAB      IAB      Ectopic  Multiple      Live Births              Family History  Problem Relation Age of Onset  . Hypertension Mother   . Hypertension Father   . Hypertension Paternal Grandmother     Social History   Tobacco Use  . Smoking status: Never Smoker  . Smokeless tobacco: Never Used  Substance Use Topics  . Alcohol use: Yes    Comment: socially  . Drug use: No    Home Medications Prior to Admission medications   Medication Sig Start Date End Date Taking? Authorizing Provider  acetaminophen (TYLENOL) 500 MG tablet Take 1,000 mg by mouth every 6 (six) hours as needed for moderate pain or headache.   Yes [provider]  aspirin-acetaminophen-caffeine (EXCEDRIN MIGRAINE) [phone] MG per tablet Take 2  tablets by mouth every 6 (six) hours as needed for headache.   Yes [provider]  cyclobenzaprine (FLEXERIL) 10 MG tablet Take 1 tablet (10 mg total) by mouth 3 (three) times daily as needed for muscle spasms. 12/29/19  Yes Muthersbaugh, Dahlia Client, PA-C  estradiol (ESTRACE) 2 MG tablet TAKE ONE (1) TABLET EACH DAY Patient taking differently: Take 2 mg by mouth daily. 07/07/20  Yes Lazaro Arms, MD  fluticasone (FLONASE) 50 MCG/ACT nasal spray Place 1 spray into both nostrils daily.   Yes [provider]  levocetirizine (XYZAL) 5 MG tablet Take 5 mg by mouth every evening.   Yes [provider]  levothyroxine (SYNTHROID, LEVOTHROID) 150 MCG tablet Take 150 mcg by mouth daily before breakfast.   Yes [provider]  LORazepam (ATIVAN) 1 MG tablet Take 1 tablet (1 mg total) by mouth every 8 (eight) hours as needed (dizziness that is not alleviated with meclizine). 09/10/20  Yes Placido Sou, PA-C  TURMERIC PO Take 1 tablet by mouth daily.   Yes [provider]  conjugated estrogens (PREMARIN) vaginal cream Use 1 gram nightly Patient not taking: Reported on 09/10/2020 06/23/18   Lazaro Arms, MD  Estradiol (DIVIGEL) 1 MG/GM GEL Place 1 packet onto the skin daily. Patient not taking: Reported on 09/10/2020 06/23/18   Lazaro Arms, MD  naproxen (NAPROSYN) 500 MG tablet Take 1 tablet (500 mg total) by mouth 2 (two) times daily with a meal. Patient not taking: Reported on 09/10/2020 12/29/19   Muthersbaugh, Dahlia Client, PA-C  predniSONE (DELTASONE) 20 MG tablet 3 tabs po daily x 3 days, then 2 tabs x 3 days, then 1.5 tabs x 3 days, then 1 tab x 3 days, then 0.5 tabs x 3 days Patient not taking: No sig reported 12/29/19   Muthersbaugh, Dahlia Client, PA-C    Allergies    Amoxicillin, Chlorhexidine gluconate, Ciprofloxacin, Levaquin [levofloxacin], and Sulfa antibiotics  Review of Systems   Review of Systems  All other systems reviewed and are negative. Ten systems  reviewed and are negative for acute change, except as noted in the HPI.   Physical Exam Updated Vital Signs BP 121/81   Pulse 65   Temp 97.8 F (36.6 C) (Oral)   Resp 16   Ht 5\' 2"  (1.575 m)   Wt 68 kg   LMP  (LMP Unknown)   SpO2 96%   BMI 27.44 kg/m   Physical Exam Vitals and nursing note reviewed.  Constitutional:      General: She is not in acute distress.    Appearance: Normal appearance. She is not ill-appearing, toxic-appearing or diaphoretic.  HENT:  Head: Normocephalic and atraumatic.     Right Ear: Tympanic membrane, ear canal and external ear normal. There is no impacted cerumen.     Left Ear: Tympanic membrane, ear canal and external ear normal. There is no impacted cerumen.     Ears:     Comments: Bilateral ears, EACs, as well as TMs appear normal.  Bilateral middle ear effusions noted.  TMs are nonbulging.  No erythema.    Nose: Nose normal.     Mouth/Throat:     Mouth: Mucous membranes are moist.     Pharynx: Oropharynx is clear. No oropharyngeal exudate or posterior oropharyngeal erythema.  Eyes:     Extraocular Movements: Extraocular movements intact.  Cardiovascular:     Rate and Rhythm: Normal rate and regular rhythm.     Pulses: Normal pulses.     Heart sounds: Normal heart sounds. No murmur heard. No friction rub. No gallop.   Pulmonary:     Effort: Pulmonary effort is normal. No respiratory distress.     Breath sounds: Normal breath sounds. No stridor. No wheezing, rhonchi or rales.  Abdominal:     General: Abdomen is flat.     Tenderness: There is no abdominal tenderness.  Musculoskeletal:        General: Normal range of motion.     Cervical back: Normal range of motion and neck supple. No tenderness.  Skin:    General: Skin is warm and dry.  Neurological:     General: No focal deficit present.     Mental Status: She is alert and oriented to person, place, and time.     Comments: A&O x3.  Speaking clearly, coherently, and in complete  sentences.  Strength is 5/5 in all 4 extremities.  Pupils are equal, round, and reactive to light.  Extraocular movements are intact.  Psychiatric:        Mood and Affect: Mood normal.        Behavior: Behavior normal.    ED Results / Procedures / Treatments   Labs (all labs ordered are listed, but only abnormal results are displayed) Labs Reviewed  COMPREHENSIVE METABOLIC PANEL - Abnormal; Notable for the following components:      Result Value   BUN 21 (*)    Creatinine, Ser 1.04 (*)    All other components within normal limits  CBC WITH DIFFERENTIAL/PLATELET - Abnormal; Notable for the following components:   Platelets 424 (*)    All other components within normal limits   EKG None  Radiology MR BRAIN WO CONTRAST  Result Date: 09/10/2020 CLINICAL DATA:  Dizziness, nonspecific. EXAM: MRI HEAD WITHOUT CONTRAST TECHNIQUE: Multiplanar, multiecho pulse sequences of the brain and surrounding structures were obtained without intravenous contrast. COMPARISON:  MRI of the brain October 15, 2018 FINDINGS: Brain: No acute infarction, hemorrhage, hydrocephalus, extra-axial collection or mass lesion. The brain parenchyma has normal morphology and signal characteristics. Vascular: Normal flow voids. Skull and upper cervical spine: Normal marrow signal. Sinuses/Orbits: Negative. Other: Mild left mastoid effusion. IMPRESSION: No acute intracranial abnormality.  Unremarkable MRI of the brain. Electronically Signed   By: Baldemar LenisKatyucia  De Macedo Rodrigues M.D.   On: 09/10/2020 11:12    Procedures Procedures   Medications Ordered in ED Medications  meclizine (ANTIVERT) tablet 25 mg (25 mg Oral Given 09/10/20 1034)  LORazepam (ATIVAN) injection 1 mg (1 mg Intravenous Given 09/10/20 1033)    ED Course  I have reviewed the triage vital signs and the nursing notes.  Pertinent labs &  imaging results that were available during my care of the patient were reviewed by me and considered in my medical decision  making (see chart for details).    MDM Rules/Calculators/A&P                          Pt is a 51 y.o. female who presents to the ED today due to dizziness that started 3 days ago.  Labs: CBC with platelets of 424. CMP with BUN of 21 and creatinine of 1.04.  Imaging: MRI of the brain is negative.   I, Placido Sou, PA-C, personally reviewed and evaluated these images and lab results as part of my medical decision-making.  Patient had reassuring lab work as well as MRI of the brain.  Likely a peripheral vertigo.  She does have a history of seasonal allergies and appears to have middle ear effusions bilaterally.  No ear pain or ear discharge.  Patient given a dose of meclizine as well as IV Ativan. She notes moderate relief of her sx. Patient can stand and ambulate unassisted.  Urged patient continue to take her meclizine up to 3 times a day.  We will also discharge on a course of Ativan to take in addition to the meclizine if she finds her symptoms are refractory.  We discussed safety regarding this medication.  Patient given ENT referral if she finds that her symptoms persist.  Discussed return precautions in length.  Feel that she is stable for discharge and she is agreeable.  Her questions were answered and she was amicable at the time of discharge.  Note: Portions of this report may have been transcribed using voice recognition software. Every effort was made to ensure accuracy; however, inadvertent computerized transcription errors may be present.   Final Clinical Impression(s) / ED Diagnoses Final diagnoses:  Dizziness   Rx / DC Orders ED Discharge Orders         Ordered    LORazepam (ATIVAN) 1 MG tablet  Every 8 hours PRN        09/10/20 1221           Placido Sou, PA-C 09/10/20 1232    Cheryll Cockayne, MD 09/10/20 1541

## 2020-09-10 NOTE — Discharge Instructions (Addendum)
Please continue to take your meclizine.  I recommend 12.5 mg up to 3 times a day for your dizziness.  I have also prescribed you a short course of Ativan.  You can take this if you are experiencing refractory symptoms after taking your meclizine.  This medication can be sedating.  Do not operate a motor vehicle after taking it.  Do not drink alcohol when you take this medication.  Below is a referral as well to ENT.  Please give them a call if you continue to experience your symptoms.  If they worsen, you can always return to the emergency department.  It was a pleasure to meet you.

## 2020-09-10 NOTE — ED Triage Notes (Signed)
Pt presents to ED with complaints of dizziness woke her up at 0200 on Friday. Pt states happens when she moves.

## 2020-09-10 NOTE — ED Notes (Signed)
Patient transported to MRI 

## 2020-09-21 ENCOUNTER — Encounter (HOSPITAL_COMMUNITY): Payer: Self-pay | Admitting: Physical Therapy

## 2020-09-21 ENCOUNTER — Ambulatory Visit (HOSPITAL_COMMUNITY): Payer: No Typology Code available for payment source | Attending: Family Medicine | Admitting: Physical Therapy

## 2020-09-21 ENCOUNTER — Other Ambulatory Visit: Payer: Self-pay

## 2020-09-21 DIAGNOSIS — M62838 Other muscle spasm: Secondary | ICD-10-CM | POA: Insufficient documentation

## 2020-09-21 DIAGNOSIS — M542 Cervicalgia: Secondary | ICD-10-CM | POA: Insufficient documentation

## 2020-09-21 DIAGNOSIS — H8111 Benign paroxysmal vertigo, right ear: Secondary | ICD-10-CM | POA: Insufficient documentation

## 2020-09-21 NOTE — Therapy (Signed)
[facility] [address] [place], [place], [address] Phone: [phone]   Fax:  [phone]  Physical Therapy Evaluation  Patient Details  Name: April Jordan MRN: [id number] Date of Birth: Jan 29, 1971 Referring Provider (PT): Fanny Bien   Encounter Date: 09/21/2020   PT End of Session - 09/21/20 1117    Visit Number 1    Number of Visits 8    Date for PT Re-Evaluation 10/21/20    Authorization Type [place] focus    Progress Note Due on Visit 87    PT Start Time 0830    PT Stop Time 0913    PT Time Calculation (min) 43 min    Activity Tolerance Patient tolerated treatment well    Behavior During Therapy [facility] for tasks assessed/performed           Past Medical History:  Diagnosis Date  . Abnormal Pap smear of cervix 06/22/2017   ASCH, -HPV, will get colpo  . Complication of anesthesia   . Heart murmur   . Hypothyroidism   . Mitral valve prolapse 1986  . Mucous membrane pemphigoid 1999  . Pemphigoid   . PONV (postoperative nausea and vomiting)   . Thyroid disease    hypothyroid  . Vaginal Pap smear, abnormal     Past Surgical History:  Procedure Laterality Date  . ABDOMINAL HYSTERECTOMY    . ABDOMINAL HYSTERECTOMY N/A 05/12/2018   Procedure: HYSTERECTOMY ABDOMINAL;  Surgeon: Lazaro Arms, MD;  Location: [facility];  Service: Gynecology;  Laterality: N/A;  . CERVICAL CONIZATION W/BX N/A 08/12/2017   Procedure: LASER CONIZATION CERVIX WITH BIOPSY;  Surgeon: Lazaro Arms, MD;  Location: [facility];  Service: Gynecology;  Laterality: N/A;  . CERVICAL DISC SURGERY    . CHOLECYSTECTOMY N/A 07/13/2013   Procedure: LAPAROSCOPIC CHOLECYSTECTOMY;  Surgeon: Dalia Heading, MD;  Location: [facility];  Service: General;  Laterality: N/A;  . COLPOSCOPY    . SALPINGOOPHORECTOMY Bilateral 05/12/2018   Procedure: BILATERAL SALPINGO OOPHORECTOMY;  Surgeon: Lazaro Arms, MD;  Location: [facility];  Service: Gynecology;  Laterality: Bilateral;   . TUBAL LIGATION    . uterine ablation      There were no vitals filed for this visit.    Subjective Assessment - 09/21/20 0822    Subjective Pt state that she woke up in the middle of the night with dizziness around 09/10/20. The dizziness was persistent therfore she went to the ER.  Labs and MRI were (-) .  She has tried completing the Eply manuever herself, taken anevert and meclizine without significant improvement.    Pertinent History cervical disectomy    Currently in Pain? No/denies              [facility] PT Assessment - 09/21/20 0001      Assessment   Medical Diagnosis dizziness    Referring Provider (PT) Fanny Bien    Onset Date/Surgical Date 09/10/20    Next MD Visit not scheduled    Prior Therapy not for this      Precautions   Precautions None      Restrictions   Weight Bearing Restrictions No      Balance Screen   Has the patient fallen in the past 6 months No    Has the patient had a decrease in activity level because of a fear of falling?  No    Is the patient reluctant to leave their home because of a fear of falling?  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 states that she has been learning that she is not able to lean back, look up or roll over    Type of Dizziness  Spinning    Frequency of Dizziness  alot, 10 x a day    Duration of Dizziness lasts 10-15 seconds now initially was almost consant.    Symptom Nature Motion provoked;Positional    Aggravating Factors Looking up to the ceiling;Lying supine    Relieving Factors Rest    Progression of Symptoms Better    History of similar episodes none      Oculomotor Exam   Ocular ROM normal    Smooth Pursuits Saccades   off balance to the Lt   Saccades Slow;Undershoots      Vestibulo-Ocular Reflex   VOR 1 Head Only (x 1 viewing) feels like motion sickness    VOR 2 Head and Object (x 2 viewing) feels like she is moving.      Positional Testing   Dix-Hallpike Dix-Hallpike Right;Dix-Hallpike Left      Dix-Hallpike Right   Dix-Hallpike Right Duration 12 seconds    Dix-Hallpike Right Symptoms Upbeat, right rotatory nystagmus      Dix-Hallpike Left   Dix-Hallpike Left Symptoms No nystagmus              Objective measurements completed on examination: See above findings.        Vestibular Treatment/Exercise - 09/21/20 0001      Vestibular Treatment/Exercise   Vestibular Treatment Provided Canalith Repositioning    Canalith Repositioning Epley Manuever Right       EPLEY MANUEVER RIGHT   Number of Reps  2    Overall Response No change                 PT Education - 09/21/20 1116    Education Details Gaze stabilization with head rotation and nods; horizontal nd vertical pursuits    Person(s) Educated Patient    Methods Explanation;Handout    Comprehension Verbalized understanding;Returned demonstration            PT Short Term Goals - 09/21/20 1127      PT SHORT TERM GOAL #1   Title Pt to be I in HEP to decrease episodes of vertigo to no more than five a day.    Time 2    Period Weeks    Status New    Target Date 10/05/20      PT SHORT TERM GOAL #2   Title Pt to be able to Single leg stance for 15 seconds on each  LE for decreased risk of falling    Time 2    Period Weeks    Status New      PT  SHORT TERM GOAL #3   Title Pt to be able to turn her head 65 degrees in both directions to look for oncoming traffic.    Time 2    Period Weeks    Status New             PT Long Term Goals - 09/21/20 1129      PT LONG TERM GOAL #1   Title Pt to have had no sx of vertigo for the past week    Time 4    Period Weeks    Status New    Target Date 10/19/20      PT LONG TERM GOAL #2   Title Pt to be driving once again    Time 4    Period Weeks    Status New      PT LONG TERM GOAL #3   Title PT to be back to work    Time 4    Period Weeks    Status New                  Plan - 09/21/20 1118    Clinical Impression Statement Ms. Myles RosenthalCruise is a 50 yo female who has been experiencing vertigo for 14 days.  She has had no trauma and just woke up being dizzy.  The symptoms are now better but continue to be debiliatating as she can not complete her housework or go to work.  Examination demonstrates nystagmus during smooth pursuits and a positive [employer] Halpike manuever.  Ms. Myles RosenthalCruise will benefit from skilled PT for Epley manuever, manual to decrease cervical tension and balance.    Examination-Activity Limitations Bed Mobility;Locomotion Level;Dressing;Stairs    Examination-Participation Restrictions Cleaning;Occupation    Stability/Clinical Decision Making Evolving/Moderate complexity    Clinical Decision Making Moderate    Rehab Potential Good    PT Frequency 2x / week    PT Duration 4 weeks    PT Treatment/Interventions Other (comment);Functional mobility training;Therapeutic exercise;Balance training;Manual techniques   cannalith repositioning   PT Next Visit Plan Complete dix halpike and Eply's if (+) Dix halpike, smooth pursuit, manual to cervical and begin habituation for sit to supine by starting pt sitting on edge of chair and extending hips as in lying back.    PT Home Exercise Plan gaze stabilizationand horizontal/vertical pursuits.           Patient will benefit from  skilled therapeutic intervention in order to improve the following deficits and impairments:  Decreased balance,Dizziness,Increased muscle spasms,Decreased range of motion  Visit Diagnosis: BPPV (benign paroxysmal positional vertigo), right  Other muscle spasm  Cervicalgia     Problem List Patient Active Problem List   Diagnosis Date Noted  . Physical exam 10/21/2018  . Screening for colorectal cancer 10/21/2018  . S/P TAH-BSO 05/12/2018  . S/P TAH-BSO (total abdominal hysterectomy and bilateral salpingo-oophorectomy) 05/12/2018  . History of abnormal cervical Pap smear 03/22/2018  . S/P laser of cervix 03/22/2018  . Dyspareunia in female 03/22/2018  . Hot flashes 03/22/2018  . Abnormal Pap smear of cervix 06/22/2017  . Thyroid nodule 06/16/2017  . Encounter for gynecological examination with Papanicolaou smear of cervix 06/16/2017  . Unspecified hypothyroidism 03/10/2013    Virgina Organynthia Wilder Kurowski, PT CLT [phone] 09/21/2020, 11:32 AM  [facility] [address]  [place], [place], [address] Phone: [phone]   Fax:  [phone]  Name: April Jordan MRN: [id number] Date of Birth: October 29, 1970

## 2020-09-26 ENCOUNTER — Ambulatory Visit (HOSPITAL_COMMUNITY): Payer: No Typology Code available for payment source | Attending: Family Medicine | Admitting: Physical Therapy

## 2020-09-26 ENCOUNTER — Other Ambulatory Visit: Payer: Self-pay

## 2020-09-26 ENCOUNTER — Encounter (HOSPITAL_COMMUNITY): Payer: Self-pay | Admitting: Physical Therapy

## 2020-09-26 DIAGNOSIS — H8111 Benign paroxysmal vertigo, right ear: Secondary | ICD-10-CM | POA: Diagnosis present

## 2020-09-26 DIAGNOSIS — M542 Cervicalgia: Secondary | ICD-10-CM | POA: Insufficient documentation

## 2020-09-26 DIAGNOSIS — M62838 Other muscle spasm: Secondary | ICD-10-CM | POA: Diagnosis present

## 2020-09-26 NOTE — Therapy (Signed)
[facility] [address] [place], [place], [address] Phone: [phone]   Fax:  [phone]  Physical Therapy Treatment  Patient Details  Name: April Jordan MRN: [id number] Date of Birth: [birth year] Referring Provider (PT): Fanny Bien   Encounter Date: 09/26/2020   PT End of Session - 09/26/20 1319    Visit Number 2    Number of Visits 8    Date for PT Re-Evaluation 10/21/20    Authorization Type [place] focus    Progress Note Due on Visit 87    PT Start Time 1307    PT Stop Time 1350    PT Time Calculation (min) 43 min    Activity Tolerance Patient tolerated treatment well    Behavior During Therapy [facility] for tasks assessed/performed           Past Medical History:  Diagnosis Date  . Abnormal Pap smear of cervix 06/22/2017   ASCH, -HPV, will get colpo  . Complication of anesthesia   . Heart murmur   . Hypothyroidism   . Mitral valve prolapse 1986  . Mucous membrane pemphigoid 1999  . Pemphigoid   . PONV (postoperative nausea and vomiting)   . Thyroid disease    hypothyroid  . Vaginal Pap smear, abnormal     Past Surgical History:  Procedure Laterality Date  . ABDOMINAL HYSTERECTOMY    . ABDOMINAL HYSTERECTOMY N/A 05/12/2018   Procedure: HYSTERECTOMY ABDOMINAL;  Surgeon: Lazaro Arms, MD;  Location: [facility];  Service: Gynecology;  Laterality: N/A;  . CERVICAL CONIZATION W/BX N/A 08/12/2017   Procedure: LASER CONIZATION CERVIX WITH BIOPSY;  Surgeon: Lazaro Arms, MD;  Location: [facility];  Service: Gynecology;  Laterality: N/A;  . CERVICAL DISC SURGERY    . CHOLECYSTECTOMY N/A 07/13/2013   Procedure: LAPAROSCOPIC CHOLECYSTECTOMY;  Surgeon: Dalia Heading, MD;  Location: [facility];  Service: General;  Laterality: N/A;  . COLPOSCOPY    . SALPINGOOPHORECTOMY Bilateral 05/12/2018   Procedure: BILATERAL SALPINGO OOPHORECTOMY;  Surgeon: Lazaro Arms, MD;  Location: [facility];  Service: Gynecology;  Laterality: Bilateral;  .  TUBAL LIGATION    . uterine ablation      There were no vitals filed for this visit.   Subjective Assessment - 09/26/20 1319    Subjective Patient says she is no better. Still dizzy every day. "I always feel like I am swaying, but it gets worse when I turn to the right and tilt my head back".    Pertinent History cervical disectomy                              Vestibular Treatment/Exercise - 09/26/20 0001      Vestibular Treatment/Exercise   Vestibular Treatment Provided Canalith Repositioning;Habituation    Canalith Repositioning Epley Manuever Right;Epley Manuever Left    Habituation Exercises Seated Horizontal Head Turns;Seated Vertical Head Turns       EPLEY MANUEVER RIGHT   Number of Reps  1    Overall Response Improved Symptoms    Response Details  7 sec LT upbeat torsional nystagmus       EPLEY MANUEVER LEFT   Number of Reps  1    Overall Response  Improved Symptoms     RESPONSE DETAILS LEFT 3 sec LT upbeat torsional nystagmus      Seated Horizontal Head Turns   Number of Reps  10      Seated  Vertical Head Turns   Number of Reps  10                   PT Short Term Goals - 09/21/20 1127      PT SHORT TERM GOAL #1   Title Pt to be I in HEP to decrease episodes of vertigo to no more than five a day.    Time 2    Period Weeks    Status New    Target Date 10/05/20      PT SHORT TERM GOAL #2   Title Pt to be able to Single leg stance for 15 seconds on each LE for decreased risk of falling    Time 2    Period Weeks    Status New      PT SHORT TERM GOAL #3   Title Pt to be able to turn her head 65 degrees in both directions to look for oncoming traffic.    Time 2    Period Weeks    Status New             PT Long Term Goals - 09/21/20 1129      PT LONG TERM GOAL #1   Title Pt to have had no sx of vertigo for the past week    Time 4    Period Weeks    Status New    Target Date 10/19/20      PT LONG TERM GOAL #2   Title  Pt to be driving once again    Time 4    Period Weeks    Status New      PT LONG TERM GOAL #3   Title PT to be back to work    Time 4    Period Weeks    Status New                 Plan - 09/26/20 1653    Clinical Impression Statement Reviewed therapy goals. Educated patient on POC and BPPV mechanisms. Performed Eppley bilateral. Patient with increased symptoms and nystagmus bilateral with RT lasting > LT. Symptoms resolved upon completion of maneuver. Retested positional sensitivity with seated head nods and rotations afterward. Patient noting no onset of dizziness with these movements. Explained purpose of this activity as habituation exercise and issued as HEP. Will assess response to positional trigger at follow up appointment.    Examination-Activity Limitations Bed Mobility;Locomotion Level;Dressing;Stairs    Examination-Participation Restrictions Cleaning;Occupation    Stability/Clinical Decision Making Evolving/Moderate complexity    Rehab Potential Good    PT Frequency 2x / week    PT Duration 4 weeks    PT Treatment/Interventions Other (comment);Functional mobility training;Therapeutic exercise;Balance training;Manual techniques   cannalith repositioning   PT Next Visit Plan Assess response to Eppley and habituation HEP. Continue habituation as needed/ tolerated and Eppleys again if indicated    PT Home Exercise Plan gaze stabilizationand horizontal/vertical pursuits. 6/1 seated head nods/ rotation    Consulted and Agree with Plan of Care Patient           Patient will benefit from skilled therapeutic intervention in order to improve the following deficits and impairments:  Decreased balance,Dizziness,Increased muscle spasms,Decreased range of motion  Visit Diagnosis: BPPV (benign paroxysmal positional vertigo), right  Other muscle spasm  Cervicalgia     Problem List Patient Active Problem List   Diagnosis Date Noted  . Physical exam 10/21/2018  .  Screening for colorectal cancer 10/21/2018  .  S/P TAH-BSO 05/12/2018  . S/P TAH-BSO (total abdominal hysterectomy and bilateral salpingo-oophorectomy) 05/12/2018  . History of abnormal cervical Pap smear 03/22/2018  . S/P laser of cervix 03/22/2018  . Dyspareunia in female 03/22/2018  . Hot flashes 03/22/2018  . Abnormal Pap smear of cervix 06/22/2017  . Thyroid nodule 06/16/2017  . Encounter for gynecological examination with Papanicolaou smear of cervix 06/16/2017  . Unspecified hypothyroidism 03/10/2013   5:26 PM, 09/26/20 Georges Lynch PT DPT  Physical Therapist with [facility]  [facility]  [phone]   [facility] [address] [place], [place], [address] Phone: [phone]   Fax:  [phone]  Name: April Jordan MRN: [id number] Date of Birth: May 27, 1970

## 2020-09-27 ENCOUNTER — Encounter (HOSPITAL_COMMUNITY): Payer: Self-pay | Admitting: Physical Therapy

## 2020-09-27 ENCOUNTER — Ambulatory Visit (HOSPITAL_COMMUNITY): Payer: No Typology Code available for payment source | Admitting: Physical Therapy

## 2020-09-27 DIAGNOSIS — M542 Cervicalgia: Secondary | ICD-10-CM

## 2020-09-27 DIAGNOSIS — H8111 Benign paroxysmal vertigo, right ear: Secondary | ICD-10-CM | POA: Diagnosis not present

## 2020-09-27 DIAGNOSIS — M62838 Other muscle spasm: Secondary | ICD-10-CM

## 2020-09-27 NOTE — Therapy (Signed)
[facility] [address] [place], [place], [address] Phone: [phone]   Fax:  [phone]  Physical Therapy Treatment  Patient Details  Name: April Jordan MRN: [id number] Date of Birth: 1970/09/19 Referring Provider (PT): Fanny Bien   Encounter Date: 09/27/2020   PT End of Session - 09/27/20 1432    Visit Number 3    Number of Visits 8    Date for PT Re-Evaluation 10/21/20    Authorization Type [place] focus    Progress Note Due on Visit 87    PT Start Time 1347    PT Stop Time 1432    PT Time Calculation (min) 45 min    Activity Tolerance Patient tolerated treatment well    Behavior During Therapy [facility] for tasks assessed/performed           Past Medical History:  Diagnosis Date  . Abnormal Pap smear of cervix 06/22/2017   ASCH, -HPV, will get colpo  . Complication of anesthesia   . Heart murmur   . Hypothyroidism   . Mitral valve prolapse 1986  . Mucous membrane pemphigoid 1999  . Pemphigoid   . PONV (postoperative nausea and vomiting)   . Thyroid disease    hypothyroid  . Vaginal Pap smear, abnormal     Past Surgical History:  Procedure Laterality Date  . ABDOMINAL HYSTERECTOMY    . ABDOMINAL HYSTERECTOMY N/A 05/12/2018   Procedure: HYSTERECTOMY ABDOMINAL;  Surgeon: Lazaro Arms, MD;  [facility]: [facility];  Service: Gynecology;  Laterality: N/A;  . CERVICAL CONIZATION W/BX N/A 08/12/2017   Procedure: LASER CONIZATION CERVIX WITH BIOPSY;  Surgeon: Lazaro Arms, MD;  Location: [facility];  Service: Gynecology;  Laterality: N/A;  . CERVICAL DISC SURGERY    . CHOLECYSTECTOMY N/A 07/13/2013   Procedure: LAPAROSCOPIC CHOLECYSTECTOMY;  Surgeon: Dalia Heading, MD;  Location: [facility];  Service: General;  Laterality: N/A;  . COLPOSCOPY    . SALPINGOOPHORECTOMY Bilateral 05/12/2018   Procedure: BILATERAL SALPINGO OOPHORECTOMY;  Surgeon: Lazaro Arms, MD;  Location: [facility];  Service: Gynecology;  Laterality: Bilateral;  .  TUBAL LIGATION    . uterine ablation      There were no vitals filed for this visit.   Subjective Assessment - 09/27/20 1353    Subjective Patient states she feels a little better. Still has some dizziness when maintaining her head in RT rotation and extended position.    Pertinent History cervical disectomy                             [facility] Adult PT Treatment/Exercise - 09/27/20 0001      Exercises   Exercises Neck      Neck Exercises: Seated   Neck Retraction 10 reps      Manual Therapy   Manual Therapy Soft tissue mobilization;Joint mobilization    Manual therapy comments Completed separate form all other activity    Joint Mobilization Grade II-II mobs to C7-T7    Soft tissue mobilization STM to RT upper trap, levator      Neck Exercises: Stretches   Upper Trapezius Stretch Right;2 reps;30 seconds           Vestibular Treatment/Exercise - 09/27/20 0001       EPLEY MANUEVER RIGHT   Number of Reps  1    Overall Response Improved Symptoms    Response Details  3 sec LT upbeat torsional nystagmus  EPLEY MANUEVER LEFT   Number of Reps  1    Overall Response  Improved Symptoms     RESPONSE DETAILS LEFT No nystagmus      Seated Horizontal Head Turns   Number of Reps  10      Seated Vertical Head Turns   Number of Reps  10                   PT Short Term Goals - 09/21/20 1127      PT SHORT TERM GOAL #1   Title Pt to be I in HEP to decrease episodes of vertigo to no more than five a day.    Time 2    Period Weeks    Status New    Target Date 10/05/20      PT SHORT TERM GOAL #2   Title Pt to be able to Single leg stance for 15 seconds on each LE for decreased risk of falling    Time 2    Period Weeks    Status New      PT SHORT TERM GOAL #3   Title Pt to be able to turn her head 65 degrees in both directions to look for oncoming traffic.    Time 2    Period Weeks    Status New             PT Long Term Goals -  09/21/20 1129      PT LONG TERM GOAL #1   Title Pt to have had no sx of vertigo for the past week    Time 4    Period Weeks    Status New    Target Date 10/19/20      PT LONG TERM GOAL #2   Title Pt to be driving once again    Time 4    Period Weeks    Status New      PT LONG TERM GOAL #3   Title PT to be back to work    Time 4    Period Weeks    Status New                 Plan - 09/27/20 1721    Clinical Impression Statement Patient with ongoing symptoms with Eppley maneuver, though less intense and reduced duration compared to yesterday. Educated patient on possibility of VBI related symptoms per report of dizziness and nausea with prolonged positioning of head in RT rotation and extension. Assessed upper thoracic mobility and patient with noted restrictions. Performed manual mobilization to address these restrictions as well as STM to RT upper trap area. Patient noted improved cervical mobility and is able to rotate to RT with decreased dizziness symptoms following. Issued chin tuck and upper trap stretching for HEP in addition to habituation exercise. Patient will continue to benefit from skilled therapy services to reduce limitations and improve functional ability.    Examination-Activity Limitations Bed Mobility;Locomotion Level;Dressing;Stairs    Examination-Participation Restrictions Cleaning;Occupation    Stability/Clinical Decision Making Evolving/Moderate complexity    Rehab Potential Good    PT Frequency 2x / week    PT Duration 4 weeks    PT Treatment/Interventions Other (comment);Functional mobility training;Therapeutic exercise;Balance training;Manual techniques   cannalith repositioning   PT Next Visit Plan Assess response to Eppley and habituation HEP. Continue habituation as needed/ tolerated and Eppleys again if indicated. F/u on response to manual treatment    PT Home Exercise Plan gaze stabilizationand horizontal/vertical pursuits.  6/1 seated head nods/  rotation 6/2 upper trap stretch, chin tuck    Consulted and Agree with Plan of Care Patient           Patient will benefit from skilled therapeutic intervention in order to improve the following deficits and impairments:  Decreased balance,Dizziness,Increased muscle spasms,Decreased range of motion  Visit Diagnosis: BPPV (benign paroxysmal positional vertigo), right  Other muscle spasm  Cervicalgia     Problem List Patient Active Problem List   Diagnosis Date Noted  . Physical exam 10/21/2018  . Screening for colorectal cancer 10/21/2018  . S/P TAH-BSO 05/12/2018  . S/P TAH-BSO (total abdominal hysterectomy and bilateral salpingo-oophorectomy) 05/12/2018  . History of abnormal cervical Pap smear 03/22/2018  . S/P laser of cervix 03/22/2018  . Dyspareunia in female 03/22/2018  . Hot flashes 03/22/2018  . Abnormal Pap smear of cervix 06/22/2017  . Thyroid nodule 06/16/2017  . Encounter for gynecological examination with Papanicolaou smear of cervix 06/16/2017  . Unspecified hypothyroidism 03/10/2013   5:23 PM, 09/27/20 Georges Lynch PT DPT  Physical Therapist with [facility]  [facility]  [phone]   [facility] [address] [place], [place], [address] Phone: [phone]   Fax:  [phone]  Name: April Jordan MRN: [id number] Date of Birth: Mar 11, 1971

## 2020-09-27 NOTE — Patient Instructions (Signed)
Access Code: [email or web address] URL: [email or web address] Date: 09/27/2020 Prepared by: Georges Lynch  Exercises Seated Upper Trap Stretch - 3 x daily - 7 x weekly - 1 sets - 3 reps - 30 second hold Seated Cervical Retraction - 3 x daily - 7 x weekly - 2 sets - 10 reps - 3 second hold

## 2020-10-02 ENCOUNTER — Ambulatory Visit (HOSPITAL_COMMUNITY): Payer: No Typology Code available for payment source | Admitting: Physical Therapy

## 2020-10-02 ENCOUNTER — Encounter (HOSPITAL_COMMUNITY): Payer: Self-pay | Admitting: Physical Therapy

## 2020-10-02 ENCOUNTER — Other Ambulatory Visit: Payer: Self-pay

## 2020-10-02 DIAGNOSIS — H8111 Benign paroxysmal vertigo, right ear: Secondary | ICD-10-CM

## 2020-10-02 DIAGNOSIS — M542 Cervicalgia: Secondary | ICD-10-CM

## 2020-10-02 DIAGNOSIS — M62838 Other muscle spasm: Secondary | ICD-10-CM

## 2020-10-02 NOTE — Therapy (Signed)
[facility] [address] [place], [place], [address] Phone: [phone]   Fax:  [phone]  Physical Therapy Treatment  Patient Details  Name: April Jordan MRN: [id number] Date of Birth: Nov 24, 1970 Referring Provider (PT): Fanny Bien   Encounter Date: 10/02/2020   PT End of Session - 10/02/20 0827    Visit Number 4    Number of Visits 8    Date for PT Re-Evaluation 10/21/20    Authorization Type [place] focus    Progress Note Due on Visit 8    PT Start Time 0830    PT Stop Time 0910    PT Time Calculation (min) 40 min    Activity Tolerance Patient tolerated treatment well    Behavior During Therapy [facility] for tasks assessed/performed           Past Medical History:  Diagnosis Date  . Abnormal Pap smear of cervix 06/22/2017   ASCH, -HPV, will get colpo  . Complication of anesthesia   . Heart murmur   . Hypothyroidism   . Mitral valve prolapse 1986  . Mucous membrane pemphigoid 1999  . Pemphigoid   . PONV (postoperative nausea and vomiting)   . Thyroid disease    hypothyroid  . Vaginal Pap smear, abnormal     Past Surgical History:  Procedure Laterality Date  . ABDOMINAL HYSTERECTOMY    . ABDOMINAL HYSTERECTOMY N/A 05/12/2018   Procedure: HYSTERECTOMY ABDOMINAL;  Surgeon: Lazaro Arms, MD;  Location: [facility];  Service: Gynecology;  Laterality: N/A;  . CERVICAL CONIZATION W/BX N/A 08/12/2017   Procedure: LASER CONIZATION CERVIX WITH BIOPSY;  Surgeon: Lazaro Arms, MD;  Location: [facility];  Service: Gynecology;  Laterality: N/A;  . CERVICAL DISC SURGERY    . CHOLECYSTECTOMY N/A 07/13/2013   Procedure: LAPAROSCOPIC CHOLECYSTECTOMY;  Surgeon: Dalia Heading, MD;  Location: [facility];  Service: General;  Laterality: N/A;  . COLPOSCOPY    . SALPINGOOPHORECTOMY Bilateral 05/12/2018   Procedure: BILATERAL SALPINGO OOPHORECTOMY;  Surgeon: Lazaro Arms, MD;  Location: [facility];  Service: Gynecology;  Laterality: Bilateral;  .  TUBAL LIGATION    . uterine ablation      There were no vitals filed for this visit.   Subjective Assessment - 10/02/20 0828    Subjective Pt states that she is about 50% better at this time; she at least can be functional and has returned to work    Pertinent History cervical disectomy    Currently in Pain? No/denies              [facility] Adult PT Treatment/Exercise - 10/02/20 0001      Manual Therapy   Manual Therapy Soft tissue mobilization;Joint mobilization    Manual therapy comments Completed separate form all other activity    Joint Mobilization Grade II-II mobs to C7-T7    Soft tissue mobilization STM to RT upper trap, levator      Neck Exercises: Stretches   Upper Trapezius Stretch Right;2 reps;30 seconds           Vestibular Treatment/Exercise - 10/02/20 0001       EPLEY MANUEVER RIGHT   Number of Reps  2    Overall Response Improved Symptoms    Response Details  1st 3 seconds upbeat; second none              Balance Exercises - 10/02/20 0001      Balance Exercises: Standing   SLS Eyes open;3 reps  with head turns   Tandem Gait Forward;Retro;2 reps    Other Standing Exercises eye conversion exercises               PT Short Term Goals - 10/02/20 2376      PT SHORT TERM GOAL #1   Title Pt to be I in HEP to decrease episodes of vertigo to no more than five a day.    Time 2    Period Weeks    Status Achieved    Target Date 10/05/20      PT SHORT TERM GOAL #2   Title Pt to be able to Single leg stance for 15 seconds on each LE for decreased risk of falling    Time 2    Period Weeks    Status Achieved      PT SHORT TERM GOAL #3   Title Pt to be able to turn her head 65 degrees in both directions to look for oncoming traffic.    Time 2    Period Weeks    Status Achieved             PT Long Term Goals - 10/02/20 2831      PT LONG TERM GOAL #1   Title Pt to have had no sx of vertigo for the past week    Time 4    Period Weeks     Status On-going      PT LONG TERM GOAL #2   Title Pt to be driving once again    Time 4    Period Weeks    Status Achieved      PT LONG TERM GOAL #3   Title PT to be back to work    Time 4    Period Weeks    Status Achieved                 Plan - 10/02/20 0917    Examination-Activity Limitations Bed Mobility;Locomotion Level;Dressing;Stairs    Examination-Participation Restrictions Cleaning;Occupation    Stability/Clinical Decision Making Evolving/Moderate complexity    Rehab Potential Good    PT Frequency 2x / week    PT Duration 4 weeks    PT Treatment/Interventions Other (comment);Functional mobility training;Therapeutic exercise;Balance training;Manual techniques   cannalith repositioning   PT Next Visit Plan Assess response to Eppley and habituation HEP. Continue habituation as needed/ tolerated and Eppleys again if indicated. F/u on response to manual treatment    PT Home Exercise Plan gaze stabilizationand horizontal/vertical pursuits. 6/1 seated head nods/ rotation 6/2 upper trap stretch, chin tuck    Consulted and Agree with Plan of Care Patient           Patient will benefit from skilled therapeutic intervention in order to improve the following deficits and impairments:  Decreased balance,Dizziness,Increased muscle spasms,Decreased range of motion  Visit Diagnosis: BPPV (benign paroxysmal positional vertigo), right  Other muscle spasm  Cervicalgia     Problem List Patient Active Problem List   Diagnosis Date Noted  . Physical exam 10/21/2018  . Screening for colorectal cancer 10/21/2018  . S/P TAH-BSO 05/12/2018  . S/P TAH-BSO (total abdominal hysterectomy and bilateral salpingo-oophorectomy) 05/12/2018  . History of abnormal cervical Pap smear 03/22/2018  . S/P laser of cervix 03/22/2018  . Dyspareunia in female 03/22/2018  . Hot flashes 03/22/2018  . Abnormal Pap smear of cervix 06/22/2017  . Thyroid nodule 06/16/2017  . Encounter for  gynecological examination with Papanicolaou smear of cervix 06/16/2017  .  Unspecified hypothyroidism 03/10/2013  Virgina Organ, PT CLT [phone] 10/02/2020, 9:19 AM  [facility] [address] [place], [place], [address] Phone: [phone]   Fax:  [phone]  Name: CAILEEN VERACRUZ MRN: [id number] Date of Birth: November 04, 1970

## 2020-10-03 ENCOUNTER — Ambulatory Visit: Payer: No Typology Code available for payment source | Admitting: Adult Health

## 2020-10-04 ENCOUNTER — Encounter (HOSPITAL_COMMUNITY): Payer: Self-pay | Admitting: Physical Therapy

## 2020-10-04 ENCOUNTER — Other Ambulatory Visit: Payer: Self-pay

## 2020-10-04 ENCOUNTER — Other Ambulatory Visit: Payer: Self-pay | Admitting: Obstetrics & Gynecology

## 2020-10-04 ENCOUNTER — Ambulatory Visit (HOSPITAL_COMMUNITY): Payer: No Typology Code available for payment source | Admitting: Physical Therapy

## 2020-10-04 DIAGNOSIS — M542 Cervicalgia: Secondary | ICD-10-CM

## 2020-10-04 DIAGNOSIS — H8111 Benign paroxysmal vertigo, right ear: Secondary | ICD-10-CM | POA: Diagnosis not present

## 2020-10-04 NOTE — Therapy (Signed)
[facility] [address] [place], [place], [address] Phone: [phone]   Fax:  [phone]  Physical Therapy Treatment  Patient Details  Name: April Jordan MRN: [id number] Date of Birth: [birth year] Referring Provider (PT): Fanny Bien   Encounter Date: 10/04/2020   PT End of Session - 10/04/20 1000     Visit Number 5    Number of Visits 8    Date for PT Re-Evaluation 10/21/20    Authorization Type [place] focus    Progress Note Due on Visit 8    PT Start Time 1000    PT Stop Time 1040    PT Time Calculation (min) 40 min    Activity Tolerance Patient tolerated treatment well    Behavior During Therapy [facility] for tasks assessed/performed             Past Medical History:  Diagnosis Date   Abnormal Pap smear of cervix 06/22/2017   [facility], -HPV, will get colpo   Complication of anesthesia    Heart murmur    Hypothyroidism    Mitral valve prolapse 1986   Mucous membrane pemphigoid 1999   Pemphigoid    PONV (postoperative nausea and vomiting)    Thyroid disease    hypothyroid   Vaginal Pap smear, abnormal     Past Surgical History:  Procedure Laterality Date   ABDOMINAL HYSTERECTOMY     ABDOMINAL HYSTERECTOMY N/A 05/12/2018   Procedure: HYSTERECTOMY ABDOMINAL;  Surgeon: Lazaro Arms, MD;  Location: [facility];  Service: Gynecology;  Laterality: N/A;   CERVICAL CONIZATION W/BX N/A 08/12/2017   Procedure: LASER CONIZATION CERVIX WITH BIOPSY;  Surgeon: Lazaro Arms, MD;  Location: [facility];  Service: Gynecology;  Laterality: N/A;   CERVICAL DISC SURGERY     CHOLECYSTECTOMY N/A 07/13/2013   Procedure: LAPAROSCOPIC CHOLECYSTECTOMY;  Surgeon: Dalia Heading, MD;  Location: [facility];  Service: General;  Laterality: N/A;   COLPOSCOPY     SALPINGOOPHORECTOMY Bilateral 05/12/2018   Procedure: BILATERAL SALPINGO OOPHORECTOMY;  Surgeon: Lazaro Arms, MD;  Location: [facility];  Service: Gynecology;  Laterality: Bilateral;   TUBAL LIGATION      uterine ablation      There were no vitals filed for this visit.   Subjective Assessment - 10/04/20 1001     Subjective States that she felt that she felt better after last session but yesterday at work when she was looking at monitors (looking up) her symptoms increased). Reports no symptoms that looking up increases her symptoms. But overall, she feels she has gotten better since the beginning. States that looking to the right also makes her symptoms worse.    Pertinent History cervical disectomy    Currently in Pain? --   mild dizziness.               [facility] PT Assessment - 10/04/20 0001       Assessment   Medical Diagnosis dizziness    Referring Provider (PT) Fanny Bien    Onset Date/Surgical Date 09/10/20                           [facility] Adult PT Treatment/Exercise - 10/04/20 0001       Manual Therapy   Manual Therapy Soft tissue mobilization    Manual therapy comments Completed separate form all other activity    Soft tissue mobilization STNM to right cervical musculature and jaw muscles on right  Vestibular Treatment/Exercise - 10/04/20 0001       Vestibular Treatment/Exercise   Canalith Repositioning Epley Manuever Right;Comment   li manoeurve   Habituation Exercises [employer]    Gaze Exercises X1 Viewing Vertical;Eye/Head Exercise Vertical       EPLEY MANUEVER RIGHT   Number of Reps  1    Overall Response Improved Symptoms    Response Details  upbeat nystagmus      OTHER   Comment li manoeurve x3 --> symptoms with extension reduced with flexion.      Austin Miles   Number of Reps  5      Seated Vertical Head Turns   Number of Reps  10      X1 Viewing Vertical   Foot Position seated x10 on target                   PT Education - 10/04/20 1042     Education Details on anatomy of canals, different manuvers, HEP, and returning to yoga as tolerated    Person(s) Educated Patient    Methods  Explanation    Comprehension Verbalized understanding              PT Short Term Goals - 10/02/20 0918       PT SHORT TERM GOAL #1   Title Pt to be I in HEP to decrease episodes of vertigo to no more than five a day.    Time 2    Period Weeks    Status Achieved    Target Date 10/05/20      PT SHORT TERM GOAL #2   Title Pt to be able to Single leg stance for 15 seconds on each LE for decreased risk of falling    Time 2    Period Weeks    Status Achieved      PT SHORT TERM GOAL #3   Title Pt to be able to turn her head 65 degrees in both directions to look for oncoming traffic.    Time 2    Period Weeks    Status Achieved               PT Long Term Goals - 10/02/20 9937       PT LONG TERM GOAL #1   Title Pt to have had no sx of vertigo for the past week    Time 4    Period Weeks    Status On-going      PT LONG TERM GOAL #2   Title Pt to be driving once again    Time 4    Period Weeks    Status Achieved      PT LONG TERM GOAL #3   Title PT to be back to work    Time 4    Period Weeks    Status Achieved                   Plan - 10/04/20 1000     Clinical Impression Statement Trialed superior canal maneuver with mixed results. Symptoms produced in extension but no nystagmus. Continued symptoms with right cervical rotation. No reproduction of symptoms with palpation of cervical or TMJ musculature. Discussed adding yoga back into routine and added Brandt daroff exercises to HEP. No change in symptoms noted end of session.    Examination-Activity Limitations Bed Mobility;Locomotion Level;Dressing;Stairs    Examination-Participation Restrictions Cleaning;Occupation    Stability/Clinical Decision Making Evolving/Moderate complexity    Rehab Potential  Good    PT Frequency 2x / week    PT Duration 4 weeks    PT Treatment/Interventions Other (comment);Functional mobility training;Therapeutic exercise;Balance training;Manual techniques   cannalith  repositioning   PT Next Visit Plan Continue habituation as needed/ tolerated and [place] again if indicated. F/u on response to manual treatment, progress VOR and balance as indicated    PT Home Exercise Plan gaze stabilizationand horizontal/vertical pursuits. 6/1 seated head nods/ rotation 6/2 upper trap stretch, chin tuck; 6/7: single leg stance with head turns, tandem gt and retro gt, eye conversion, 6/9 brandt daroff    Consulted and Agree with Plan of Care Patient             Patient will benefit from skilled therapeutic intervention in order to improve the following deficits and impairments:  Decreased balance, Dizziness, Increased muscle spasms, Decreased range of motion  Visit Diagnosis: BPPV (benign paroxysmal positional vertigo), right  Cervicalgia     Problem List Patient Active Problem List   Diagnosis Date Noted   Physical exam 10/21/2018   Screening for colorectal cancer 10/21/2018   S/P TAH-BSO 05/12/2018   S/P TAH-BSO (total abdominal hysterectomy and bilateral salpingo-oophorectomy) 05/12/2018   History of abnormal cervical Pap smear 03/22/2018   S/P laser of cervix 03/22/2018   Dyspareunia in female 03/22/2018   Hot flashes 03/22/2018   Abnormal Pap smear of cervix 06/22/2017   Thyroid nodule 06/16/2017   Encounter for gynecological examination with Papanicolaou smear of cervix 06/16/2017   Unspecified hypothyroidism 03/10/2013   10:45 AM, 10/04/20 Tereasa Coop, DPT Physical Therapy with [facility]  [phone] office   [facility] [address] [place], [place], [address] Phone: [phone]   Fax:  [phone]  Name: April Jordan MRN: [id number] Date of Birth: [birth year]

## 2020-10-08 ENCOUNTER — Telehealth (HOSPITAL_COMMUNITY): Payer: Self-pay | Admitting: Physical Therapy

## 2020-10-08 NOTE — Telephone Encounter (Signed)
Patient l/m to D/c and cx all future appointments per A Denny.

## 2020-10-09 ENCOUNTER — Encounter (HOSPITAL_COMMUNITY): Payer: No Typology Code available for payment source | Admitting: Physical Therapy

## 2020-10-11 ENCOUNTER — Encounter (HOSPITAL_COMMUNITY): Payer: No Typology Code available for payment source | Admitting: Physical Therapy

## 2020-10-15 ENCOUNTER — Encounter (HOSPITAL_COMMUNITY): Payer: No Typology Code available for payment source | Admitting: Physical Therapy

## 2020-10-17 ENCOUNTER — Encounter (HOSPITAL_COMMUNITY): Payer: No Typology Code available for payment source | Admitting: Physical Therapy

## 2020-10-24 ENCOUNTER — Ambulatory Visit (INDEPENDENT_AMBULATORY_CARE_PROVIDER_SITE_OTHER): Payer: No Typology Code available for payment source | Admitting: Adult Health

## 2020-10-24 ENCOUNTER — Other Ambulatory Visit (HOSPITAL_COMMUNITY): Payer: Self-pay

## 2020-10-24 ENCOUNTER — Other Ambulatory Visit: Payer: Self-pay

## 2020-10-24 ENCOUNTER — Encounter: Payer: Self-pay | Admitting: Adult Health

## 2020-10-24 VITALS — BP 123/86 | HR 90 | Ht 62.0 in | Wt 155.0 lb

## 2020-10-24 DIAGNOSIS — N760 Acute vaginitis: Secondary | ICD-10-CM | POA: Diagnosis not present

## 2020-10-24 DIAGNOSIS — Z90721 Acquired absence of ovaries, unilateral: Secondary | ICD-10-CM

## 2020-10-24 DIAGNOSIS — B9689 Other specified bacterial agents as the cause of diseases classified elsewhere: Secondary | ICD-10-CM

## 2020-10-24 DIAGNOSIS — Z90710 Acquired absence of both cervix and uterus: Secondary | ICD-10-CM | POA: Diagnosis not present

## 2020-10-24 DIAGNOSIS — Z1211 Encounter for screening for malignant neoplasm of colon: Secondary | ICD-10-CM

## 2020-10-24 DIAGNOSIS — Z79818 Long term (current) use of other agents affecting estrogen receptors and estrogen levels: Secondary | ICD-10-CM

## 2020-10-24 DIAGNOSIS — Z01419 Encounter for gynecological examination (general) (routine) without abnormal findings: Secondary | ICD-10-CM

## 2020-10-24 DIAGNOSIS — Z79899 Other long term (current) drug therapy: Secondary | ICD-10-CM | POA: Diagnosis not present

## 2020-10-24 LAB — HEMOCCULT GUIAC POC 1CARD (OFFICE): Fecal Occult Blood, POC: NEGATIVE

## 2020-10-24 LAB — POCT WET PREP (WET MOUNT)
Clue Cells Wet Prep Whiff POC: NEGATIVE
WBC, Wet Prep HPF POC: POSITIVE

## 2020-10-24 MED ORDER — PREMARIN 0.625 MG/GM VA CREA
0.5000 g | TOPICAL_CREAM | VAGINAL | 3 refills | Status: AC
  Filled 2020-10-24 – 2020-12-25 (×2): qty 30, 90d supply, fill #0
  Filled 2021-04-08: qty 30, 90d supply, fill #1

## 2020-10-24 MED ORDER — METRONIDAZOLE 0.75 % VA GEL: 1.0000 | Freq: Every day | VAGINAL | 0 refills | Status: AC

## 2020-10-24 MED ORDER — ESTRADIOL 2 MG PO TABS: 2.0000 mg | ORAL_TABLET | Freq: Every day | ORAL | 4 refills | Status: DC

## 2020-10-24 NOTE — Progress Notes (Signed)
Patient ID: April Jordan, female   DOB: 08/30/1970, 50 y.o.   MRN: [id number] History of Present Illness: April Jordan is a 50 year old white female, married, sp hysterectomy with BSO in for well woman gyn exam. Has had episode of vertigo recently, but is better. She is working at [employer] still. PCP is [facility].   Current Medications, Allergies, Past Medical History, Past Surgical History, Family History and Social History were reviewed in [employer] record.     Review of Systems: Patient denies any headaches, hearing loss, fatigue, blurred vision, shortness of breath, chest pain, abdominal pain, problems with bowel movements, urination, or intercourse. (Has discomfort at times, dry)No joint pain or mood swings.     Physical Exam:BP 123/86 (BP Location: Left Arm, Patient Position: Sitting, Cuff Size: Normal)   Pulse 90   Ht 5\' 2"  (1.575 m)   Wt 155 lb (70.3 kg)   LMP  (LMP Unknown)   BMI 28.35 kg/m   General:  Well developed, well nourished, no acute distress Skin:  Warm and dry Neck:  Midline trachea, normal thyroid, good ROM, no lymphadenopathy Lungs; Clear to auscultation bilaterally Breast:  No dominant palpable mass, retraction, or nipple discharge Cardiovascular: Regular rate and rhythm Abdomen:  Soft, non tender, no hepatosplenomegaly Pelvic:  External genitalia is normal in appearance, no lesions.  The vagina is pale with loss of moisture, the vaginal cuff is irritated, wet prep:+WBC and clues cells. Urethra has no lesions or masses. The cervix and uterus are absent. No adnexal masses or tenderness noted.Bladder is non tender, no masses felt. Rectal: Good sphincter tone, no polyps, or hemorrhoids felt.  Hemoccult negative. Extremities/musculoskeletal:  No swelling or varicosities noted, no clubbing or cyanosis Psych:  No mood changes, alert and cooperative,seems happy AA is 3 Fall risk is low Depression screen [facility] 2/9 10/24/2020 10/21/2018 06/16/2017   Decreased Interest 0 0 0  Down, Depressed, Hopeless 0 0 0  PHQ - 2 Score 0 0 0  Altered sleeping 0 - -  Tired, decreased energy 0 - -  Change in appetite 0 - -  Feeling bad or failure about yourself  0 - -  Trouble concentrating 0 - -  Moving slowly or fidgety/restless 0 - -  Suicidal thoughts 0 - -  PHQ-9 Score 0 - -    GAD 7 : Generalized Anxiety Score 10/24/2020  Nervous, Anxious, on Edge 0  Control/stop worrying 0  Worry too much - different things 0  Trouble relaxing 0  Restless 0  Easily annoyed or irritable 0  Afraid - awful might happen 0  Total GAD 7 Score 0      Upstream - 10/24/20 1028       Pregnancy Intention Screening   Does the patient want to become pregnant in the next year? N/A    Does the patient's partner want to become pregnant in the next year? N/A    Would the patient like to discuss contraceptive options today? N/A      Contraception Wrap Up   Current Method Female Sterilization   hysterectomy   End Method Female Sterilization   hysterectomy   Contraception Counseling Provided No            Examination chaperoned by 10/26/20 LPN  Impression and Plan: 1. Encounter for well woman exam with routine gynecological exam Physical in 1 year Labs with PCP Mammogram yearly  2. Encounter for screening fecal occult blood testing   3. S/P hysterectomy with oophorectomy  4. Current use of estrogen therapy Will refill estrace  2 mg daily and PVC Meds ordered this encounter  Medications   estradiol (ESTRACE) 2 MG tablet    Sig: Take 1 tablet (2 mg total) by mouth daily.    Dispense:  90 tablet    Refill:  4    Order Specific Question:   Supervising Provider    Answer:   Duane Lope H [[id number]]   metroNIDAZOLE (METROGEL VAGINAL) 0.75 % vaginal gel    Sig: Place 1 Applicatorful vaginally at bedtime.    Dispense:  70 g    Refill:  0    Order Specific Question:   Supervising Provider    Answer:   Despina Hidden, LUTHER H [[id number]]   conjugated  estrogens (PREMARIN) vaginal cream    Sig: Use 0.5 gm in vagina 2-3 x weekly    Dispense:  42.5 g    Refill:  3    Order Specific Question:   Supervising Provider    Answer:   EURE, LUTHER H [[id number]]     5. BV (bacterial vaginosis) Will rx metrogel and and recheck cuff in 2 weeks

## 2020-11-01 ENCOUNTER — Other Ambulatory Visit (HOSPITAL_COMMUNITY): Payer: Self-pay

## 2020-11-07 ENCOUNTER — Other Ambulatory Visit: Payer: Self-pay

## 2020-11-07 ENCOUNTER — Ambulatory Visit (INDEPENDENT_AMBULATORY_CARE_PROVIDER_SITE_OTHER): Payer: No Typology Code available for payment source | Admitting: Adult Health

## 2020-11-07 ENCOUNTER — Encounter: Payer: Self-pay | Admitting: Adult Health

## 2020-11-07 VITALS — BP 110/71 | HR 63 | Ht 63.0 in | Wt 157.0 lb

## 2020-11-07 DIAGNOSIS — N760 Acute vaginitis: Secondary | ICD-10-CM | POA: Diagnosis not present

## 2020-11-07 DIAGNOSIS — Z90710 Acquired absence of both cervix and uterus: Secondary | ICD-10-CM | POA: Diagnosis not present

## 2020-11-07 DIAGNOSIS — B9689 Other specified bacterial agents as the cause of diseases classified elsewhere: Secondary | ICD-10-CM | POA: Diagnosis not present

## 2020-11-07 DIAGNOSIS — Z90721 Acquired absence of ovaries, unilateral: Secondary | ICD-10-CM

## 2020-11-07 DIAGNOSIS — Z79899 Other long term (current) drug therapy: Secondary | ICD-10-CM

## 2020-11-07 NOTE — Progress Notes (Signed)
  Subjective:     Patient ID: April Jordan, female   DOB: 12/07/1970, 50 y.o.   MRN: [id number]  HPI April Jordan is a 50 year old white female, married, sp hysterectomy BSO back in follow up to check vaginal cuff was friable and had BV last visit and has used Metrogel. PCP is [facility].  Review of Systems Has felt a little sore, and has seen blood after metrogel Reviewed past medical,surgical, social and family history. Reviewed medications and allergies.     Objective:   Physical Exam BP 110/71 (BP Location: Left Arm, Patient Position: Sitting, Cuff Size: Normal)   Pulse 63   Ht 5\' 3"  (1.6 m)   Wt 157 lb (71.2 kg)   LMP  (LMP Unknown)   BMI 27.81 kg/m     Skin warm and dry.Pelvic: external genitalia is normal in appearance no lesions, vagina:pale, the vaginal cuff, is non friable and no lesions,urethra has no lesions or masses noted, cervix and uterus absent. Exam without chaperone. Fall risk is low.  Assessment:     1. Current use of estrogen therapy Continue to use PVC 2-3 x a week  2. BV (bacterial vaginosis),resolved   3. S/P hysterectomy with oophorectomy      Plan:     Follow up prn

## 2020-12-25 ENCOUNTER — Other Ambulatory Visit (HOSPITAL_COMMUNITY): Payer: Self-pay

## 2021-03-15 ENCOUNTER — Other Ambulatory Visit (HOSPITAL_COMMUNITY): Payer: Self-pay

## 2021-03-15 MED ORDER — PIMECROLIMUS 1 % EX CREA
1.0000 "application " | TOPICAL_CREAM | Freq: Two times a day (BID) | CUTANEOUS | 1 refills | Status: AC
  Filled 2021-03-15: qty 60, 30d supply, fill #0
  Filled 2021-05-15 – 2021-06-17 (×3): qty 60, 30d supply, fill #1

## 2021-03-15 MED ORDER — FAMOTIDINE 40 MG PO TABS
40.0000 mg | ORAL_TABLET | Freq: Two times a day (BID) | ORAL | 3 refills | Status: AC
  Filled 2021-03-15: qty 60, 30d supply, fill #0
  Filled 2021-04-08: qty 60, 30d supply, fill #1
  Filled 2021-05-15: qty 60, 30d supply, fill #2
  Filled 2021-06-17: qty 60, 30d supply, fill #3

## 2021-03-15 MED ORDER — LEVOCETIRIZINE DIHYDROCHLORIDE 5 MG PO TABS
5.0000 mg | ORAL_TABLET | Freq: Every evening | ORAL | 3 refills | Status: AC
  Filled 2021-03-15: qty 30, 30d supply, fill #0
  Filled 2021-04-08: qty 30, 30d supply, fill #1
  Filled 2021-05-15: qty 30, 30d supply, fill #2
  Filled 2021-06-17: qty 30, 30d supply, fill #3

## 2021-03-15 MED ORDER — TRIAMCINOLONE ACETONIDE 0.1 % EX CREA
TOPICAL_CREAM | Freq: Two times a day (BID) | CUTANEOUS | 1 refills | Status: AC
  Filled 2021-03-15: qty 30, 7d supply, fill #0
  Filled 2021-05-15: qty 30, 7d supply, fill #1

## 2021-03-15 MED ORDER — HYDROXYZINE HCL 25 MG PO TABS
25.0000 mg | ORAL_TABLET | Freq: Three times a day (TID) | ORAL | 3 refills | Status: AC | PRN
  Filled 2021-03-15: qty 90, 30d supply, fill #0
  Filled 2021-04-08: qty 90, 30d supply, fill #1
  Filled 2021-05-15: qty 90, 30d supply, fill #2
  Filled 2021-06-17: qty 90, 30d supply, fill #3

## 2021-04-08 ENCOUNTER — Other Ambulatory Visit (HOSPITAL_COMMUNITY): Payer: Self-pay

## 2021-05-08 ENCOUNTER — Encounter (HOSPITAL_COMMUNITY): Payer: Self-pay | Admitting: Physical Therapy

## 2021-05-08 NOTE — Therapy (Signed)
[place], [place], [address] Phone: [phone]   Fax:  [phone]  Patient Details  Name: April Jordan MRN: [id number] Date of Birth: [birth year] Referring Provider:  No ref. provider found  Encounter Date: 05/08/2021  PHYSICAL THERAPY DISCHARGE SUMMARY  Visits from Start of Care: 5  Current functional level related to goals / functional outcomes: NA   Remaining deficits: NA   Education / Equipment: Patient DC per self request    Patient agrees to discharge. Patient goals were not met. Patient is being discharged due to the patient's request.  11:44 AM, 05/08/21 Josue Hector PT DPT  Physical Therapist with [facility]  [phone] [place], [place], [address] Phone: [phone]   Fax:  [phone]

## 2021-05-15 ENCOUNTER — Other Ambulatory Visit (HOSPITAL_COMMUNITY): Payer: Self-pay

## 2021-05-16 ENCOUNTER — Other Ambulatory Visit (HOSPITAL_COMMUNITY): Payer: Self-pay

## 2021-05-24 ENCOUNTER — Other Ambulatory Visit (HOSPITAL_COMMUNITY): Payer: Self-pay

## 2021-06-17 ENCOUNTER — Other Ambulatory Visit (HOSPITAL_COMMUNITY): Payer: Self-pay

## 2021-09-24 ENCOUNTER — Ambulatory Visit (INDEPENDENT_AMBULATORY_CARE_PROVIDER_SITE_OTHER): Payer: No Typology Code available for payment source | Admitting: Physician Assistant

## 2021-09-24 ENCOUNTER — Encounter: Payer: Self-pay | Admitting: Physician Assistant

## 2021-09-24 DIAGNOSIS — L2084 Intrinsic (allergic) eczema: Secondary | ICD-10-CM | POA: Diagnosis not present

## 2021-09-24 MED ORDER — OPZELURA 1.5 % EX CREA: TOPICAL_CREAM | CUTANEOUS | 6 refills | Status: DC

## 2021-09-24 NOTE — Patient Instructions (Signed)
Dupilumab Injection ?What is this medication? ?DUPILUMAB (doo PIL ue mab) treats some types of skin conditions, such as eczema. It may also be used to treat conditions that cause inflammation in the sinuses and esophagus. It can be used to prevent the symptoms of asthma. It works by decreasing inflammation. Do not use it to treat a sudden asthma attack. ?This medicine may be used for other purposes; ask your health care provider or pharmacist if you have questions. ?COMMON BRAND NAME(S): DUPIXENT ?What should I tell my care team before I take this medication? ?They need to know if you have any of these conditions: ?Asthma ?Eye disease ?Parasitic (helminth) infection ?An unusual or allergic reaction to dupilumab, other medicines, foods, dyes, or preservatives ?Pregnant or trying to get pregnant ?Breast-feeding ?How should I use this medication? ?This medication is injected under the skin. You will be taught how to prepare and give it. Take it as directed on the prescription label. Keep taking it unless your care team tells you to stop. ?If you use a pen, be sure to take off the outer needle cover before using the dose. ?It is important that you put your used needles and syringes in a special sharps container. Do not put them in a trash can. If you do not have a sharps container, call your pharmacist or care team to get one. ?A patient package insert for the product will be given with each prescription and refill. Be sure to read this information carefully each time. The sheet may change often. ?This medication comes with INSTRUCTIONS FOR USE. Ask your pharmacist for directions on how to use this medication. Read the information carefully. Talk to your care team if you have questions. ?Talk to your care team about the use of this medication in children. While this medication may be prescribed for children as young as 6 years for selected conditions, precautions do apply. ?Overdosage: If you think you have taken too  much of this medicine contact a poison control center or emergency room at once. ?NOTE: This medicine is only for you. Do not share this medicine with others. ?What if I miss a dose? ?It is important not to miss any doses. Talk to your care team about what to do if you miss a dose. ?What may interact with this medication? ?Interactions are not expected. ?This list may not describe all possible interactions. Give your health care provider a list of all the medicines, herbs, non-prescription drugs, or dietary supplements you use. Also tell them if you smoke, drink alcohol, or use illegal drugs. Some items may interact with your medicine. ?What should I watch for while using this medication? ?Visit your care team for regular checks on your progress. Tell your care team if your symptoms do not start to get better or if they get worse. ?This medication can decrease the response to a vaccine. If you need to get vaccinated, tell your care team if you have received this medication. Talk to your care team to see if a different vaccination schedule is needed. ?If you take this medication for asthma, you and your care team should develop an Asthma Action Plan that is just for you. Be sure to know what to do if you are in the yellow (asthma is getting worse) or red (medical alert) zones. This medication is not used to treat sudden breathing problems. ?What side effects may I notice from receiving this medication? ?Side effects that you should report to your care team as soon   as possible: ?Allergic reactions--skin rash, itching, hives, swelling of the face, lips, tongue, or throat ?Eye pain, redness, irritation, or discharge with blurry or decreased vision ?Unusual weakness or fatigue, fever, headache, skin rash, muscle or joint pain, loss of appetite, pain, tingling, or numbness in the hands or feet ?Side effects that usually do not require medical attention (report these to your care team if they continue or are  bothersome): ?Cold sores ?Dry eyes ?Joint pain ?Pain, redness, or irritation at injection site ?Sore throat ?Trouble sleeping ?This list may not describe all possible side effects. Call your doctor for medical advice about side effects. You may report side effects to FDA at 1-800-FDA-1088. ?Where should I keep my medication? ?Keep out of the reach of children and pets. ?Store in the refrigerator at 2 to 8 degrees C (36 to 46 degrees F). Do not freeze. Do not shake. Keep this medication in the original packaging until you are ready to take it. Protect from light. Get rid of any unused medication after the expiration date. ?This medication may be stored at room temperature up to 25 degrees C (77 degrees F) for up to 14 days. Keep this medication in the original packaging. Protect from light. If it is stored at room temperature, get rid of any unused medication after 14 days or after it expires, whichever is first. ?To get rid of medications that are no longer needed or have expired: ?Take the medication to a medication take-back program. Check with your pharmacy or law enforcement to find a location. ?If you cannot return the medication, ask your pharmacist or care team how to get rid of this medication safely. ?NOTE: This sheet is a summary. It may not cover all possible information. If you have questions about this medicine, talk to your doctor, pharmacist, or health care provider. ?? 2023 Elsevier/Gold Standard (2021-02-07 00:00:00) ? ?

## 2021-09-25 ENCOUNTER — Telehealth: Payer: Self-pay | Admitting: Physician Assistant

## 2021-09-25 ENCOUNTER — Other Ambulatory Visit (HOSPITAL_COMMUNITY): Payer: Self-pay

## 2021-09-25 ENCOUNTER — Encounter: Payer: Self-pay | Admitting: Physician Assistant

## 2021-09-25 DIAGNOSIS — L2084 Intrinsic (allergic) eczema: Secondary | ICD-10-CM

## 2021-09-25 MED ORDER — OPZELURA 1.5 % EX CREA
1.0000 "application " | TOPICAL_CREAM | Freq: Every day | CUTANEOUS | 6 refills | Status: AC
  Filled 2021-09-25 – 2021-09-30 (×2): qty 60, 30d supply, fill #0
  Filled 2021-12-25: qty 60, 30d supply, fill #1
  Filled 2022-02-27 – 2022-03-27 (×3): qty 60, 30d supply, fill #2

## 2021-09-25 NOTE — Telephone Encounter (Signed)
Left message for patient letting her know that I sent her prescription to [facility] and to call if too expensive.

## 2021-09-25 NOTE — Telephone Encounter (Signed)
Patient left message on office voice mail that she called [facility] and they no longer take her insurance and they no longer have coupon vouchers.  Patient would like her prescription sent in to [facility].

## 2021-09-25 NOTE — Progress Notes (Signed)
   New Patient   Subjective  April Jordan is a 51 y.o. female who presents for the following: Eczema (Arms, neck, face & scalp - tx- pimecrolimus cream & triamcinolone cream ).   The following portions of the chart were reviewed this encounter and updated as appropriate:  Tobacco  Allergies  Meds  Problems  Med Hx  Surg Hx  Fam Hx      Objective  Well appearing patient in no apparent distress; mood and affect are within normal limits.  All skin waist up examined.  Generalized, Left Upper Eyelid, Right Upper Eyelid Thin scaly erythematous macules   Assessment & Plan  Intrinsic atopic dermatitis Left Upper Eyelid; Right Upper Eyelid; Generalized  Continue Elidel if she is unable to obtain opzelura- if no improvement can discuss starting dupixent.  Sent to patient's pharmacy via phone encounter.  Ruxolitinib Phosphate (OPZELURA) 1.5 % CREA - Generalized, Left Upper Eyelid, Right Upper Eyelid Apply to affected area qd     I, Tevion Laforge, PA-C, have reviewed all documentation's for this visit.  The documentation on 09/25/21 for the exam, diagnosis, procedures and orders are all accurate and complete.

## 2021-09-26 ENCOUNTER — Telehealth: Payer: Self-pay | Admitting: *Deleted

## 2021-09-26 NOTE — Telephone Encounter (Signed)
Prior authorization done via cover my meds for Obzelura. Was approved.

## 2021-09-30 ENCOUNTER — Other Ambulatory Visit (HOSPITAL_COMMUNITY): Payer: Self-pay

## 2021-10-31 ENCOUNTER — Other Ambulatory Visit: Payer: Self-pay | Admitting: Obstetrics & Gynecology

## 2021-10-31 ENCOUNTER — Other Ambulatory Visit: Payer: Self-pay | Admitting: Adult Health

## 2021-11-29 ENCOUNTER — Telehealth: Payer: No Typology Code available for payment source | Admitting: Physician Assistant

## 2021-11-29 DIAGNOSIS — E039 Hypothyroidism, unspecified: Secondary | ICD-10-CM

## 2021-11-29 MED ORDER — SYNTHROID 150 MCG PO TABS: 150.0000 ug | ORAL_TABLET | Freq: Every morning | ORAL | 0 refills | Status: AC

## 2021-11-29 NOTE — Patient Instructions (Signed)
Nanine Means, thank you for joining Margaretann Loveless, PA-C for today's virtual visit.  While this provider is not your primary care provider (PCP), if your PCP is located in our provider database this encounter information will be shared with them immediately following your visit.  Consent: (Patient) April Jordan provided verbal consent for this virtual visit at the beginning of the encounter.  Current Medications:  Current Outpatient Medications:    acetaminophen (TYLENOL) 500 MG tablet, Take 1,000 mg by mouth every 6 (six) hours as needed for moderate pain or headache., Disp: , Rfl:    aspirin-acetaminophen-caffeine (EXCEDRIN MIGRAINE) 250-250-65 MG per tablet, Take 2 tablets by mouth every 6 (six) hours as needed for headache., Disp: , Rfl:    conjugated estrogens (PREMARIN) vaginal cream, Use 0.5 gram in vagina 2-3 times a week., Disp: 30 g, Rfl: 3   cyclobenzaprine (FLEXERIL) 10 MG tablet, Take 1 tablet (10 mg total) by mouth 3 (three) times daily as needed for muscle spasms., Disp: 30 tablet, Rfl: 0   estradiol (ESTRACE) 2 MG tablet, TAKE ONE (1) TABLET BY MOUTH EVERY DAY, Disp: 30 tablet, Rfl: 3   famotidine (PEPCID) 40 MG tablet, Take 1 tablet (40 mg total) by mouth 2 (two) times daily., Disp: 60 tablet, Rfl: 3   fluticasone (FLONASE) 50 MCG/ACT nasal spray, Place 1 spray into both nostrils daily., Disp: , Rfl:    hydrOXYzine (ATARAX) 25 MG tablet, Take 1 tablet (25 mg total) by mouth every 8 (eight) hours as needed, Disp: 90 tablet, Rfl: 3   levocetirizine (XYZAL) 5 MG tablet, Take 5 mg by mouth every evening., Disp: , Rfl:    levocetirizine (XYZAL) 5 MG tablet, Take 1 tablet (5 mg total) by mouth every evening., Disp: 30 tablet, Rfl: 3   metroNIDAZOLE (METROGEL VAGINAL) 0.75 % vaginal gel, Place 1 Applicatorful vaginally at bedtime., Disp: 70 g, Rfl: 0   naproxen (NAPROSYN) 500 MG tablet, Take 1 tablet (500 mg total) by mouth 2 (two) times daily with a meal., Disp: 30 tablet,  Rfl: 0   pimecrolimus (ELIDEL) 1 % cream, Apply 1 application topically 2 (two) times daily., Disp: 60 g, Rfl: 1   Ruxolitinib Phosphate (OPZELURA) 1.5 % CREA, Apply 1 application topically to the affected area daily., Disp: 60 g, Rfl: 6   SYNTHROID 150 MCG tablet, Take 1 tablet (150 mcg total) by mouth every morning., Disp: 30 tablet, Rfl: 0   triamcinolone cream (KENALOG) 0.1 %, Apply 1 application externally 2 (two) times daily., Disp: 90 g, Rfl: 1   TURMERIC PO, Take 1 tablet by mouth daily., Disp: , Rfl:    Medications ordered in this encounter:  Meds ordered this encounter  Medications   SYNTHROID 150 MCG tablet    Sig: Take 1 tablet (150 mcg total) by mouth every morning.    Dispense:  30 tablet    Refill:  0    Please give Synthroid brand only    Order Specific Question:   Supervising Provider    Answer:   Eber Hong [[id number]]     *If you need refills on other medications prior to your next appointment, please contact your pharmacy*  Follow-Up: Call back or seek an in-person evaluation if the symptoms worsen or if the condition fails to improve as anticipated.  Other Instructions  30 day supply of Synthroid prescribed to [facility] for an urgent refill Follow up with Primary Care Provider for long-term refills   If you have been instructed  to have an in-person evaluation today at a local Urgent Care facility, please use the link below. It will take you to a list of all of our available [facility], including address, phone number and hours of operation. Please do not delay care.  [facility]  If you or a family member do not have a primary care provider, use the link below to schedule a visit and establish care. When you choose a [facility] primary care physician or advanced practice provider, you gain a long-term partner in health. Find a Primary Care Provider  Learn more about [facility]'s in-office and virtual care  options: [facility] - Get Care Now

## 2021-11-29 NOTE — Progress Notes (Signed)
Virtual Visit Consent   April Jordan, you are scheduled for a virtual visit with a [facility] provider today. Just as with appointments in the office, your consent must be obtained to participate. Your consent will be active for this visit and any virtual visit you may have with one of our providers in the next 365 days. If you have a MyChart account, a copy of this consent can be sent to you electronically.  As this is a virtual visit, video technology does not allow for your provider to perform a traditional examination. This may limit your provider's ability to fully assess your condition. If your provider identifies any concerns that need to be evaluated in person or the need to arrange testing (such as labs, EKG, etc.), we will make arrangements to do so. Although advances in technology are sophisticated, we cannot ensure that it will always work on either your end or our end. If the connection with a video visit is poor, the visit may have to be switched to a telephone visit. With either a video or telephone visit, we are not always able to ensure that we have a secure connection.  By engaging in this virtual visit, you consent to the provision of healthcare and authorize for your insurance to be billed (if applicable) for the services provided during this visit. Depending on your insurance coverage, you may receive a charge related to this service.  I need to obtain your verbal consent now. Are you willing to proceed with your visit today? April Jordan has provided verbal consent on 11/29/2021 for a virtual visit (video or telephone). Margaretann Loveless, PA-C  Date: 11/29/2021 4:02 PM  Virtual Visit via Video Note   I, Margaretann Loveless, connected with  April Jordan  ([id number], [birth year]) on 11/29/21 at  4:00 PM EDT by a video-enabled telemedicine application and verified that I am speaking with the correct person using two identifiers.  Location: Patient: Virtual Visit Location  Patient: Home Provider: Virtual Visit Location Provider: Home Office   I discussed the limitations of evaluation and management by telemedicine and the availability of in person appointments. The patient expressed understanding and agreed to proceed.    History of Present Illness: Quinteria Chisum is a 51 y.o. who identifies as a female who was assigned female at birth, and is being seen today for Synthroid refill. Stable on medication. She is going out of town and did not realize she had no refills left. Her PCP office is closed today so was unable to get a refill there.   Problems:  Patient Active Problem List   Diagnosis Date Noted   Current use of estrogen therapy 10/24/2020   S/P hysterectomy with oophorectomy 10/24/2020   Encounter for screening fecal occult blood testing 10/24/2020   Encounter for well woman exam with routine gynecological exam 10/24/2020   BV (bacterial vaginosis) 10/24/2020   Physical exam 10/21/2018   Screening for colorectal cancer 10/21/2018   S/P TAH-BSO 05/12/2018   S/P TAH-BSO (total abdominal hysterectomy and bilateral salpingo-oophorectomy) 05/12/2018   History of abnormal cervical Pap smear 03/22/2018   S/P laser of cervix 03/22/2018   Dyspareunia in female 03/22/2018   Hot flashes 03/22/2018   Abnormal Pap smear of cervix 06/22/2017   Thyroid nodule 06/16/2017   Encounter for gynecological examination with Papanicolaou smear of cervix 06/16/2017   Unspecified hypothyroidism 03/10/2013    Allergies:  Allergies  Allergen Reactions   Amoxicillin Rash    DID THE REACTION INVOLVE: Swelling  of the face/tongue/throat, SOB, or low BP? No Sudden or severe rash/hives, skin peeling, or the inside of the mouth or nose? No Did it require medical treatment? No When did it last happen?10 years ago       If all above answers are "NO", may proceed with cephalosporin use.    Chlorhexidine Gluconate Rash   Ciprofloxacin Rash   Levaquin [Levofloxacin] Rash    Sulfa Antibiotics Rash   Medications:  Current Outpatient Medications:    acetaminophen (TYLENOL) 500 MG tablet, Take 1,000 mg by mouth every 6 (six) hours as needed for moderate pain or headache., Disp: , Rfl:    aspirin-acetaminophen-caffeine (EXCEDRIN MIGRAINE) 250-250-65 MG per tablet, Take 2 tablets by mouth every 6 (six) hours as needed for headache., Disp: , Rfl:    conjugated estrogens (PREMARIN) vaginal cream, Use 0.5 gram in vagina 2-3 times a week., Disp: 30 g, Rfl: 3   cyclobenzaprine (FLEXERIL) 10 MG tablet, Take 1 tablet (10 mg total) by mouth 3 (three) times daily as needed for muscle spasms., Disp: 30 tablet, Rfl: 0   estradiol (ESTRACE) 2 MG tablet, TAKE ONE (1) TABLET BY MOUTH EVERY DAY, Disp: 30 tablet, Rfl: 3   famotidine (PEPCID) 40 MG tablet, Take 1 tablet (40 mg total) by mouth 2 (two) times daily., Disp: 60 tablet, Rfl: 3   fluticasone (FLONASE) 50 MCG/ACT nasal spray, Place 1 spray into both nostrils daily., Disp: , Rfl:    hydrOXYzine (ATARAX) 25 MG tablet, Take 1 tablet (25 mg total) by mouth every 8 (eight) hours as needed, Disp: 90 tablet, Rfl: 3   levocetirizine (XYZAL) 5 MG tablet, Take 5 mg by mouth every evening., Disp: , Rfl:    levocetirizine (XYZAL) 5 MG tablet, Take 1 tablet (5 mg total) by mouth every evening., Disp: 30 tablet, Rfl: 3   metroNIDAZOLE (METROGEL VAGINAL) 0.75 % vaginal gel, Place 1 Applicatorful vaginally at bedtime., Disp: 70 g, Rfl: 0   naproxen (NAPROSYN) 500 MG tablet, Take 1 tablet (500 mg total) by mouth 2 (two) times daily with a meal., Disp: 30 tablet, Rfl: 0   pimecrolimus (ELIDEL) 1 % cream, Apply 1 application topically 2 (two) times daily., Disp: 60 g, Rfl: 1   Ruxolitinib Phosphate (OPZELURA) 1.5 % CREA, Apply 1 application topically to the affected area daily., Disp: 60 g, Rfl: 6   SYNTHROID 150 MCG tablet, Take 1 tablet (150 mcg total) by mouth every morning., Disp: 30 tablet, Rfl: 0   triamcinolone cream (KENALOG) 0.1 %, Apply 1  application externally 2 (two) times daily., Disp: 90 g, Rfl: 1   TURMERIC PO, Take 1 tablet by mouth daily., Disp: , Rfl:   Observations/Objective: Patient is well-developed, well-nourished in no acute distress.  Resting comfortably at home.  Head is normocephalic, atraumatic.  No labored breathing.  Speech is clear and coherent with logical content.  Patient is alert and oriented at baseline.    Assessment and Plan: 1. Acquired hypothyroidism - SYNTHROID 150 MCG tablet; Take 1 tablet (150 mcg total) by mouth every morning.  Dispense: 30 tablet; Refill: 0  - Medication refilled x 30 days  - Follow up with PCP for continued refills  Follow Up Instructions: I discussed the assessment and treatment plan with the patient. The patient was provided an opportunity to ask questions and all were answered. The patient agreed with the plan and demonstrated an understanding of the instructions.  A copy of instructions were sent to the patient via MyChart unless otherwise  noted below.    The patient was advised to call back or seek an in-person evaluation if the symptoms worsen or if the condition fails to improve as anticipated.  Time:  I spent 8 minutes with the patient via telehealth technology discussing the above problems/concerns.    Margaretann Loveless, PA-C

## 2021-12-25 ENCOUNTER — Encounter: Payer: Self-pay | Admitting: Physician Assistant

## 2021-12-25 ENCOUNTER — Other Ambulatory Visit (HOSPITAL_COMMUNITY): Payer: Self-pay

## 2022-01-01 ENCOUNTER — Other Ambulatory Visit (HOSPITAL_COMMUNITY): Payer: Self-pay

## 2022-01-01 DIAGNOSIS — Z1231 Encounter for screening mammogram for malignant neoplasm of breast: Secondary | ICD-10-CM

## 2022-02-12 ENCOUNTER — Ambulatory Visit (HOSPITAL_COMMUNITY)
Admission: RE | Admit: 2022-02-12 | Discharge: 2022-02-12 | Disposition: A | Discharge status: Home / Self Care | Payer: No Typology Code available for payment source | Source: Ambulatory Visit

## 2022-02-12 DIAGNOSIS — Z1231 Encounter for screening mammogram for malignant neoplasm of breast: Secondary | ICD-10-CM | POA: Diagnosis present

## 2022-02-27 ENCOUNTER — Other Ambulatory Visit (HOSPITAL_COMMUNITY): Payer: Self-pay

## 2022-02-28 ENCOUNTER — Other Ambulatory Visit (HOSPITAL_COMMUNITY): Payer: Self-pay

## 2022-02-28 MED ORDER — PREMARIN 0.625 MG/GM VA CREA
0.5000 g | TOPICAL_CREAM | VAGINAL | 5 refills | Status: AC
  Filled 2022-02-28 – 2022-03-27 (×2): qty 30, 90d supply, fill #0

## 2022-03-04 ENCOUNTER — Other Ambulatory Visit (HOSPITAL_COMMUNITY): Payer: Self-pay

## 2022-03-10 ENCOUNTER — Other Ambulatory Visit (HOSPITAL_COMMUNITY): Payer: Self-pay

## 2022-03-13 ENCOUNTER — Other Ambulatory Visit (HOSPITAL_COMMUNITY): Payer: Self-pay

## 2022-03-14 ENCOUNTER — Other Ambulatory Visit (HOSPITAL_COMMUNITY): Payer: Self-pay

## 2022-03-17 ENCOUNTER — Ambulatory Visit: Payer: No Typology Code available for payment source | Admitting: Obstetrics & Gynecology

## 2022-03-19 ENCOUNTER — Other Ambulatory Visit (HOSPITAL_COMMUNITY): Payer: Self-pay

## 2022-03-24 ENCOUNTER — Other Ambulatory Visit (HOSPITAL_COMMUNITY)
Admission: RE | Admit: 2022-03-24 | Discharge: 2022-03-24 | Disposition: A | Discharge status: Home / Self Care | Payer: No Typology Code available for payment source | Source: Ambulatory Visit | Attending: Family Medicine | Admitting: Family Medicine

## 2022-03-24 DIAGNOSIS — Z1212 Encounter for screening for malignant neoplasm of rectum: Secondary | ICD-10-CM | POA: Diagnosis not present

## 2022-03-24 DIAGNOSIS — Z1211 Encounter for screening for malignant neoplasm of colon: Secondary | ICD-10-CM | POA: Insufficient documentation

## 2022-03-24 LAB — CBC WITH DIFFERENTIAL/PLATELET
Abs Immature Granulocytes: 0.03 10*3/uL (ref 0.00–0.07)
Basophils Absolute: 0 10*3/uL (ref 0.0–0.1)
Basophils Relative: 1 %
Eosinophils Absolute: 0.1 10*3/uL (ref 0.0–0.5)
Eosinophils Relative: 1 %
HCT: 39.9 % (ref 36.0–46.0)
Hemoglobin: 13.3 g/dL (ref 12.0–15.0)
Immature Granulocytes: 0 %
Lymphocytes Relative: 24 %
Lymphs Abs: 2.1 10*3/uL (ref 0.7–4.0)
MCH: 30.2 pg (ref 26.0–34.0)
MCHC: 33.3 g/dL (ref 30.0–36.0)
MCV: 90.7 fL (ref 80.0–100.0)
Monocytes Absolute: 0.7 10*3/uL (ref 0.1–1.0)
Monocytes Relative: 8 %
Neutro Abs: 5.8 10*3/uL (ref 1.7–7.7)
Neutrophils Relative %: 66 %
Platelets: 499 10*3/uL — ABNORMAL HIGH (ref 150–400)
RBC: 4.4 MIL/uL (ref 3.87–5.11)
RDW: 12.4 % (ref 11.5–15.5)
WBC: 8.7 10*3/uL (ref 4.0–10.5)
nRBC: 0 % (ref 0.0–0.2)

## 2022-03-24 LAB — TECHNOLOGIST SMEAR REVIEW

## 2022-03-24 LAB — FERRITIN: Ferritin: 47 ng/mL (ref 11–307)

## 2022-03-27 ENCOUNTER — Other Ambulatory Visit (HOSPITAL_COMMUNITY): Payer: Self-pay

## 2022-03-27 MED ORDER — ESTRADIOL 0.05 MG/24HR TD PTWK
0.0500 mg | MEDICATED_PATCH | TRANSDERMAL | 3 refills | Status: AC
  Filled 2022-03-27: qty 4, 28d supply, fill #0
  Filled 2022-04-14 – 2022-04-18 (×2): qty 4, 28d supply, fill #1
  Filled 2022-05-13 (×2): qty 4, 28d supply, fill #2
  Filled 2022-06-09: qty 4, 28d supply, fill #3

## 2022-04-14 ENCOUNTER — Other Ambulatory Visit: Payer: Self-pay

## 2022-04-14 ENCOUNTER — Other Ambulatory Visit (HOSPITAL_COMMUNITY): Payer: Self-pay

## 2022-04-18 ENCOUNTER — Other Ambulatory Visit: Payer: Self-pay

## 2022-04-18 ENCOUNTER — Other Ambulatory Visit (HOSPITAL_COMMUNITY): Payer: Self-pay

## 2022-05-05 ENCOUNTER — Ambulatory Visit: Payer: 59

## 2022-05-05 DIAGNOSIS — Z20828 Contact with and (suspected) exposure to other viral communicable diseases: Secondary | ICD-10-CM | POA: Diagnosis not present

## 2022-05-05 DIAGNOSIS — R030 Elevated blood-pressure reading, without diagnosis of hypertension: Secondary | ICD-10-CM | POA: Diagnosis not present

## 2022-05-05 DIAGNOSIS — J069 Acute upper respiratory infection, unspecified: Secondary | ICD-10-CM | POA: Diagnosis not present

## 2022-05-05 DIAGNOSIS — R0602 Shortness of breath: Secondary | ICD-10-CM | POA: Diagnosis not present

## 2022-05-05 DIAGNOSIS — R059 Cough, unspecified: Secondary | ICD-10-CM | POA: Diagnosis not present

## 2022-05-13 ENCOUNTER — Other Ambulatory Visit (HOSPITAL_COMMUNITY): Payer: Self-pay

## 2022-06-13 IMAGING — MG MM DIGITAL SCREENING BILAT W/ TOMO AND CAD
8 series · 9 of 24 positions shown · non-contrast
Comparison: Previous exam(s).

CLINICAL DATA: Screening.

EXAM:
DIGITAL SCREENING BILATERAL MAMMOGRAM WITH TOMOSYNTHESIS AND CAD
TECHNIQUE: Bilateral screening digital craniocaudal and mediolateral oblique
mammograms were obtained. Bilateral screening digital breast
tomosynthesis was performed. The images were evaluated with
computer-aided detection.

[L MLO synth-2D]
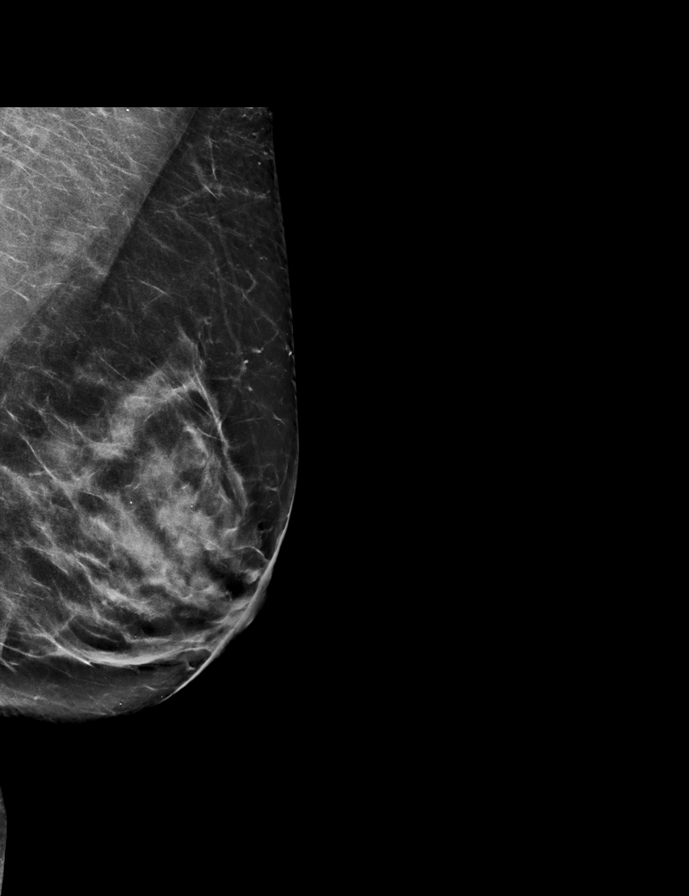

[R CC synth-2D]
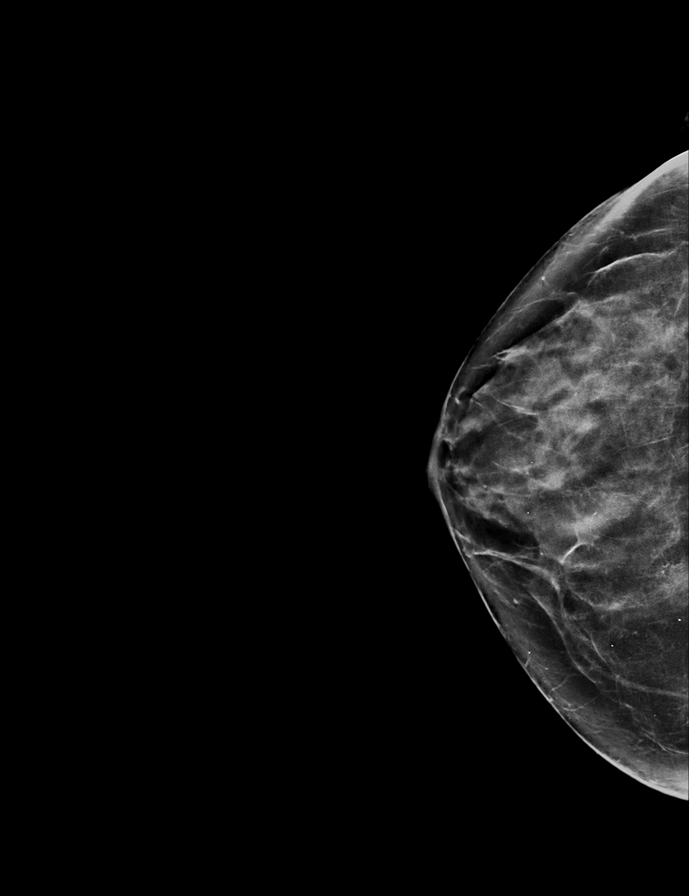

[L CC synth-2D]
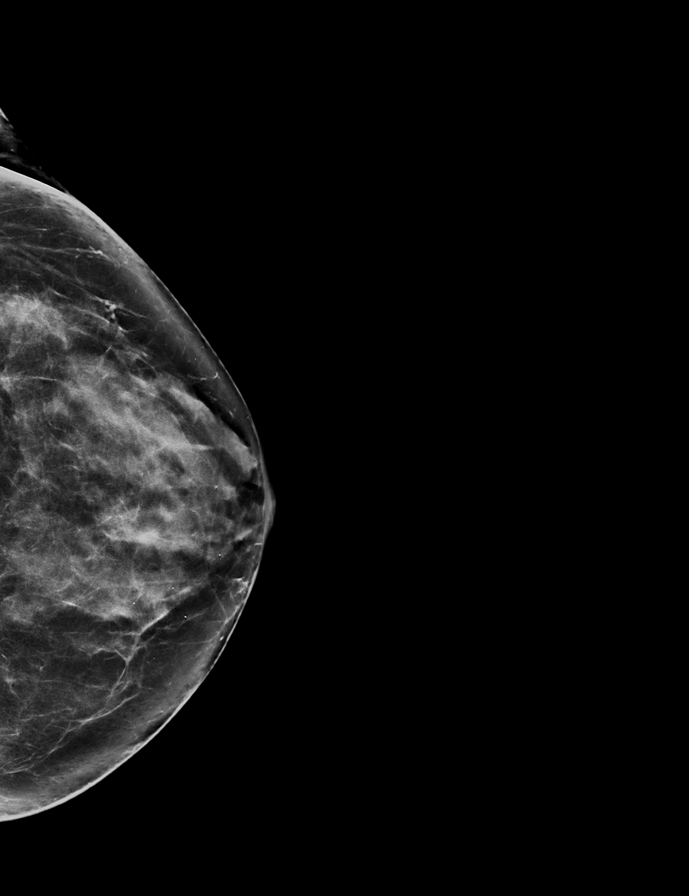

[R MLO synth-2D]
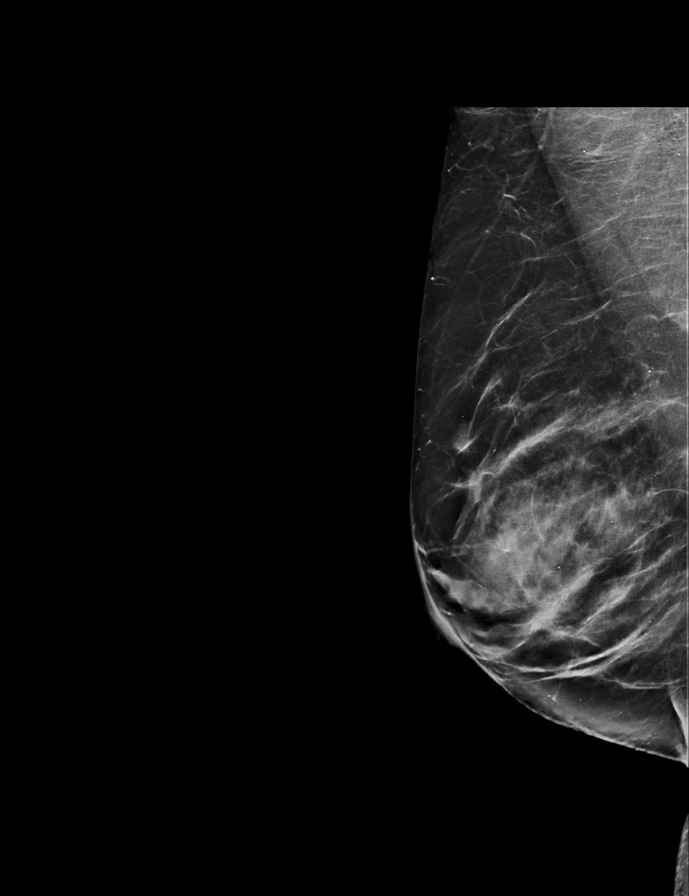

[L MLO tomo · 2 of 72 frames shown]
[frame 24/72]
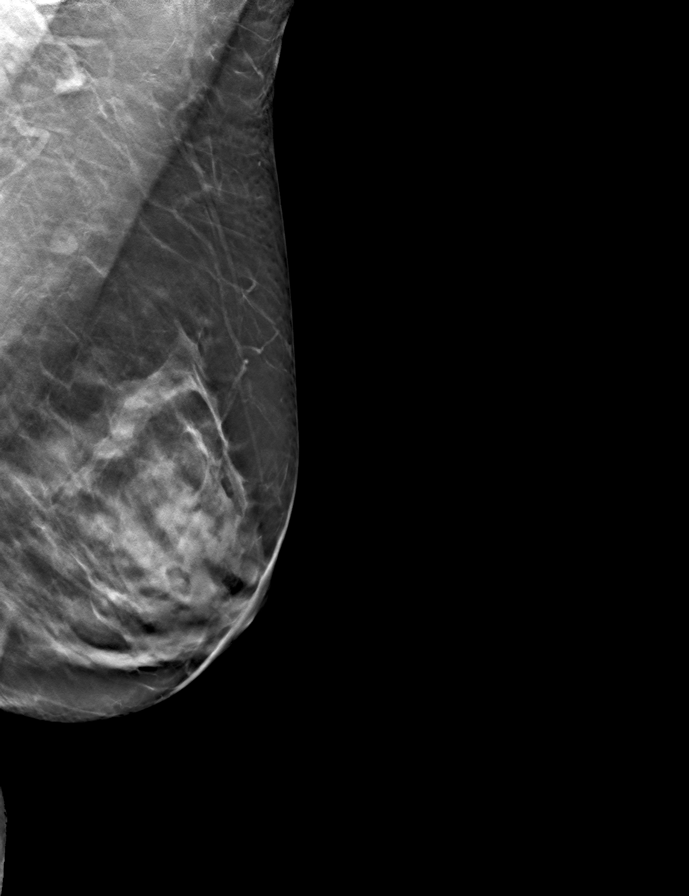
[frame 37/72]
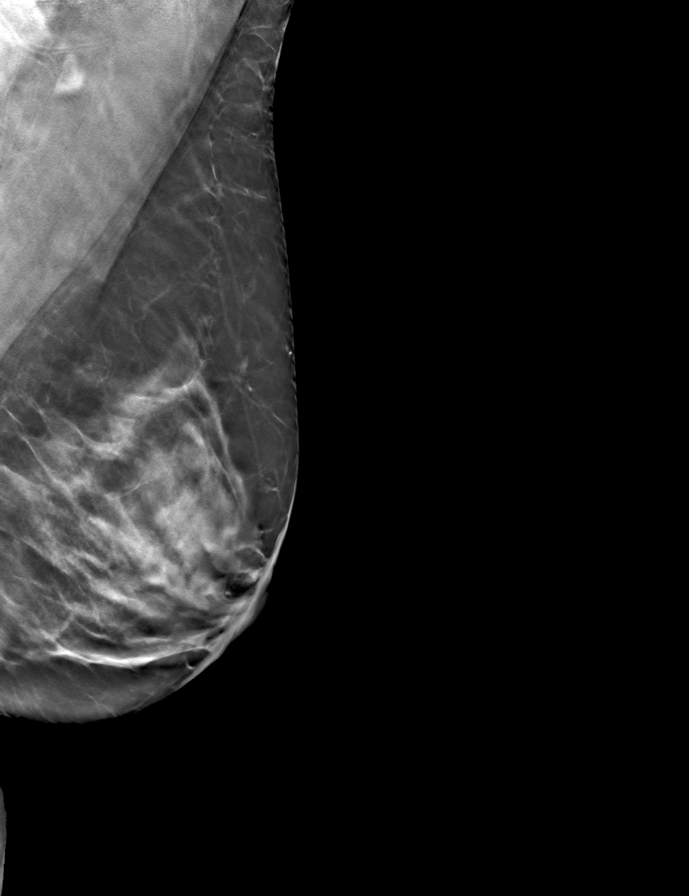

[L CC tomo · tomo slice 34/67.0]
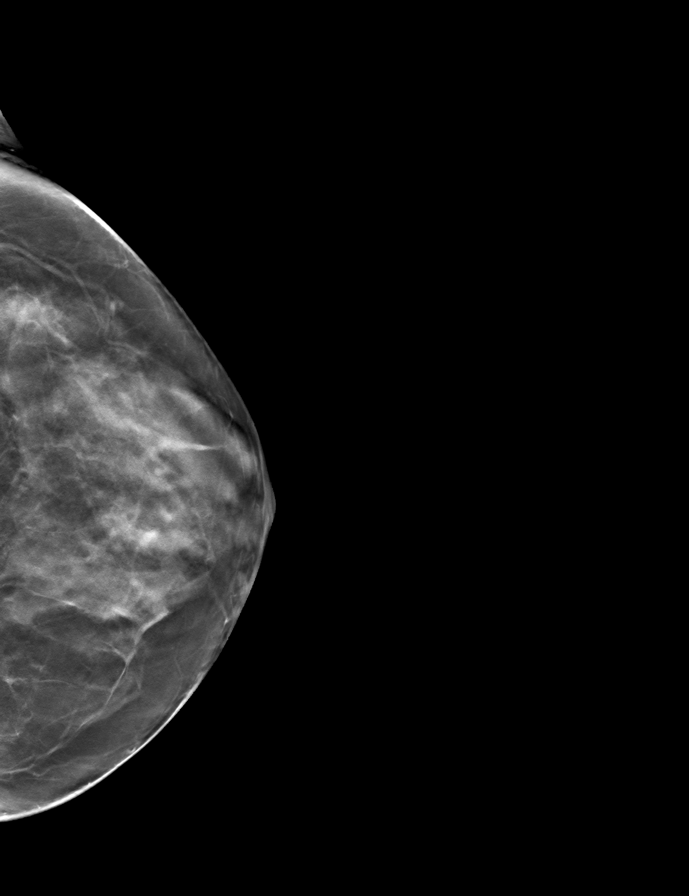

[R MLO tomo · tomo slice 34/67.0]
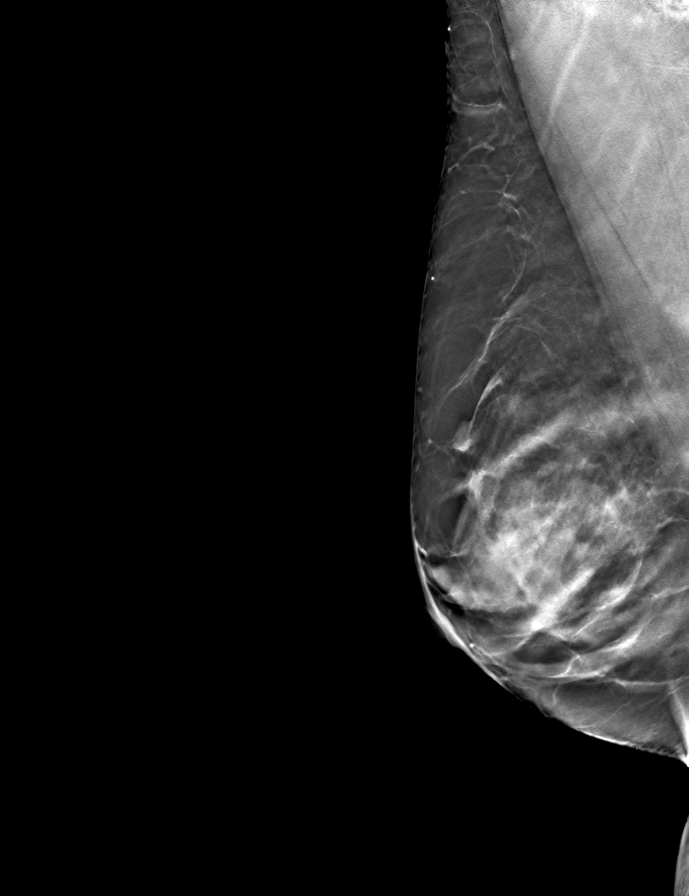

[R CC tomo · tomo slice 35/68.0]
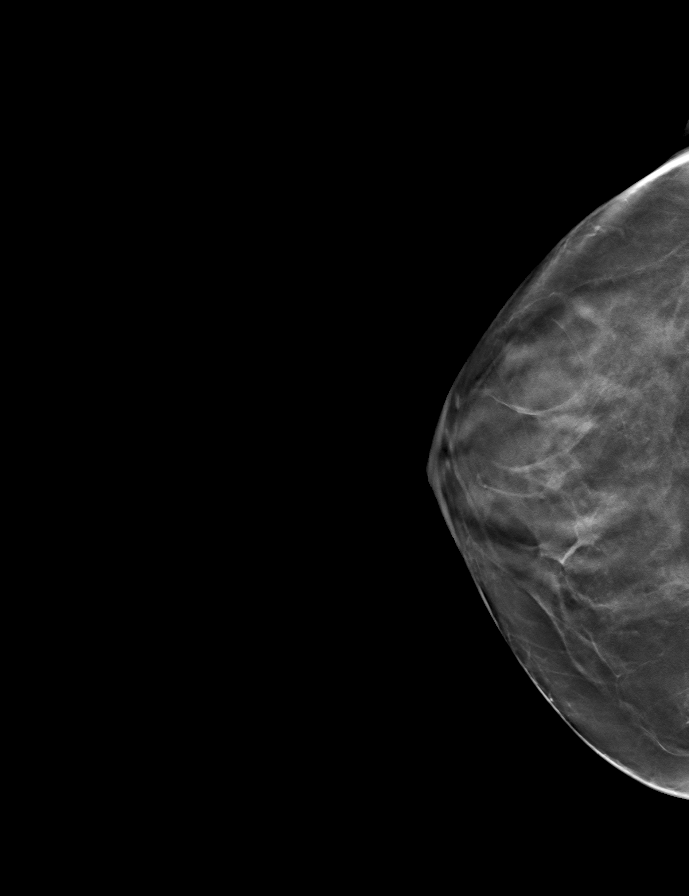

[9 of 24 positions shown; findings below may reference images not displayed]

ACR Breast Density Category c: The breast tissue is heterogeneously
dense, which may obscure small masses.
FINDINGS: There are no findings suspicious for malignancy. The images were
evaluated with computer-aided detection.
IMPRESSION: No mammographic evidence of malignancy. A result letter of this
screening mammogram will be mailed directly to the patient.

RECOMMENDATION:
Screening mammogram in one year. (Code:T4-5-GWO)

BI-RADS CATEGORY  1: Negative.

## 2022-07-03 ENCOUNTER — Other Ambulatory Visit (HOSPITAL_COMMUNITY): Payer: Self-pay

## 2022-07-03 DIAGNOSIS — I7389 Other specified peripheral vascular diseases: Secondary | ICD-10-CM | POA: Diagnosis not present

## 2022-07-03 MED ORDER — ESTRADIOL 0.05 MG/24HR TD PTWK
0.0500 mg | MEDICATED_PATCH | TRANSDERMAL | 2 refills | Status: AC
  Filled 2022-07-03: qty 12, 84d supply, fill #0
  Filled 2022-09-30: qty 12, 84d supply, fill #1
  Filled 2022-12-22: qty 12, 84d supply, fill #2

## 2022-07-08 ENCOUNTER — Other Ambulatory Visit: Payer: Self-pay

## 2022-07-25 ENCOUNTER — Ambulatory Visit
Admission: RE | Admit: 2022-07-25 | Discharge status: Absent without leave | Payer: Commercial Managed Care - PPO | Source: Ambulatory Visit | Attending: Family Medicine | Admitting: Family Medicine

## 2022-07-25 ENCOUNTER — Ambulatory Visit
Admission: EM | Admit: 2022-07-25 | Discharge: 2022-07-25 | Disposition: A | Discharge status: Home / Self Care | Payer: Commercial Managed Care - PPO | Attending: Family Medicine | Admitting: Family Medicine

## 2022-07-25 DIAGNOSIS — H6991 Unspecified Eustachian tube disorder, right ear: Secondary | ICD-10-CM | POA: Diagnosis not present

## 2022-07-25 MED ORDER — PREDNISONE 50 MG PO TABS: ORAL_TABLET | ORAL | 0 refills | Status: AC

## 2022-07-25 NOTE — ED Triage Notes (Signed)
Pt presents with c/o right ear pain, dizziness and fullness X 2 weeks. Pt states she has tried Debrox and irrigating her ear, states her pain has been worsening.

## 2022-07-25 NOTE — ED Provider Notes (Signed)
[facility]    CSN: [id number] Arrival date & time: 07/25/22  [id number]      History   Chief Complaint No chief complaint on file.   HPI April Jordan is a 52 y.o. female.   Patient presenting today with 2-week history of right ear pressure and discomfort, occasional dizziness.  Has been trying Debrox drops, ear irrigation with minimal relief.  States has been having some mild drainage from the ear the past few days.  Denies fever, chills, loss of hearing, headache, nausea, vomiting.    Past Medical History:  Diagnosis Date   Abnormal Pap smear of cervix 06/22/2017   ASCH, -HPV, will get colpo   Complication of anesthesia    Heart murmur    Hypothyroidism    Mitral valve prolapse 1986   Mucous membrane pemphigoid 1999   Pemphigoid    PONV (postoperative nausea and vomiting)    Thyroid disease    hypothyroid   Vaginal Pap smear, abnormal     Patient Active Problem List   Diagnosis Date Noted   Current use of estrogen therapy 10/24/2020   S/P hysterectomy with oophorectomy 10/24/2020   Encounter for screening fecal occult blood testing 10/24/2020   Encounter for well woman exam with routine gynecological exam 10/24/2020   BV (bacterial vaginosis) 10/24/2020   Physical exam 10/21/2018   Screening for colorectal cancer 10/21/2018   S/P TAH-BSO 05/12/2018   S/P TAH-BSO (total abdominal hysterectomy and bilateral salpingo-oophorectomy) 05/12/2018   History of abnormal cervical Pap smear 03/22/2018   S/P laser of cervix 03/22/2018   Dyspareunia in female 03/22/2018   Hot flashes 03/22/2018   Abnormal Pap smear of cervix 06/22/2017   Thyroid nodule 06/16/2017   Encounter for gynecological examination with Papanicolaou smear of cervix 06/16/2017   Unspecified hypothyroidism 03/10/2013    Past Surgical History:  Procedure Laterality Date   ABDOMINAL HYSTERECTOMY     ABDOMINAL HYSTERECTOMY N/A 05/12/2018   Procedure: HYSTERECTOMY ABDOMINAL;  Surgeon: Florian Buff, MD;  Location: [facility];  Service: Gynecology;  Laterality: N/A;   CERVICAL CONIZATION W/BX N/A 08/12/2017   Procedure: LASER CONIZATION CERVIX WITH BIOPSY;  Surgeon: Florian Buff, MD;  Location: [facility];  Service: Gynecology;  Laterality: N/A;   CERVICAL DISC SURGERY     CHOLECYSTECTOMY N/A 07/13/2013   Procedure: LAPAROSCOPIC CHOLECYSTECTOMY;  Surgeon: Jamesetta So, MD;  Location: [facility];  Service: General;  Laterality: N/A;   COLPOSCOPY     SALPINGOOPHORECTOMY Bilateral 05/12/2018   Procedure: BILATERAL SALPINGO OOPHORECTOMY;  Surgeon: Florian Buff, MD;  Location: [facility];  Service: Gynecology;  Laterality: Bilateral;   TUBAL LIGATION     uterine ablation      OB History     Gravida  1   Para  1   Term  1   Preterm      AB      Living  1      SAB      IAB      Ectopic      Multiple      Live Births               Home Medications    Prior to Admission medications   Medication Sig Start Date End Date Taking? Authorizing Provider  predniSONE (DELTASONE) 50 MG tablet Take 1 tab daily with breakfast for 3 days 07/25/22  Yes Volney American, PA-C  acetaminophen (TYLENOL) 500 MG tablet Take 1,000 mg by mouth every  6 (six) hours as needed for moderate pain or headache.    [provider]  aspirin-acetaminophen-caffeine (EXCEDRIN MIGRAINE) [phone] MG per tablet Take 2 tablets by mouth every 6 (six) hours as needed for headache.    [provider]  conjugated estrogens (PREMARIN) vaginal cream Use 0.5 gram in vagina 2-3 times a week. 10/24/20   Estill Dooms, NP  conjugated estrogens (PREMARIN) vaginal cream Place 0.5 g vaginally 2 (two) times a week as directed. 03/03/22     cyclobenzaprine (FLEXERIL) 10 MG tablet Take 1 tablet (10 mg total) by mouth 3 (three) times daily as needed for muscle spasms. 12/29/19   Muthersbaugh, Jarrett Soho, PA-C  estradiol (CLIMARA - DOSED IN MG/24 HR) 0.05 mg/24hr patch Place 1 patch (0.05 mg total) onto  the skin once a week. 03/27/22     estradiol (CLIMARA - DOSED IN MG/24 HR) 0.05 mg/24hr patch Place 1 patch (0.05 mg total) onto the skin once a week. 07/03/22     estradiol (ESTRACE) 2 MG tablet TAKE ONE (1) TABLET BY MOUTH EVERY DAY 10/31/21   Estill Dooms, NP  famotidine (PEPCID) 40 MG tablet Take 1 tablet (40 mg total) by mouth 2 (two) times daily. 03/15/21     fluticasone (FLONASE) 50 MCG/ACT nasal spray Place 1 spray into both nostrils daily.    [provider]  hydrOXYzine (ATARAX) 25 MG tablet Take 1 tablet (25 mg total) by mouth every 8 (eight) hours as needed 03/15/21     levocetirizine (XYZAL) 5 MG tablet Take 5 mg by mouth every evening.    [provider]  levocetirizine (XYZAL) 5 MG tablet Take 1 tablet (5 mg total) by mouth every evening. 03/15/21     metroNIDAZOLE (METROGEL VAGINAL) 0.75 % vaginal gel Place 1 Applicatorful vaginally at bedtime. 10/24/20   Estill Dooms, NP  naproxen (NAPROSYN) 500 MG tablet Take 1 tablet (500 mg total) by mouth 2 (two) times daily with a meal. 12/29/19   Muthersbaugh, Jarrett Soho, PA-C  pimecrolimus (ELIDEL) 1 % cream Apply 1 application topically 2 (two) times daily. 03/15/21     Ruxolitinib Phosphate (OPZELURA) 1.5 % CREA Apply 1 application topically to the affected area daily. 09/25/21   Sheffield, Kelli R, PA-C  SYNTHROID 150 MCG tablet Take 1 tablet (150 mcg total) by mouth every morning. 11/29/21   Mar Daring, PA-C  triamcinolone cream (KENALOG) 0.1 % Apply 1 application externally 2 (two) times daily. 03/15/21     TURMERIC PO Take 1 tablet by mouth daily.    [provider]    Family History Family History  Problem Relation Age of Onset   Hypertension Mother    Hypertension Father    Hypertension Paternal Grandmother     Social History Social History   Tobacco Use   Smoking status: Never   Smokeless tobacco: Never  Substance Use Topics   Alcohol use: Yes    Comment: socially   Drug use: No      Allergies   Amoxicillin, Chlorhexidine gluconate, Ciprofloxacin, Levaquin [levofloxacin], and Sulfa antibiotics   Review of Systems Review of Systems HPI  Physical Exam Triage Vital Signs ED Triage Vitals  Enc Vitals Group     BP 07/25/22 0902 122/78     Pulse Rate 07/25/22 0902 60     Resp 07/25/22 0902 18     Temp 07/25/22 0902 97.9 F (36.6 C)     Temp Source 07/25/22 0902 Oral     SpO2 07/25/22 0902  98 %     Weight --      Height --      Head Circumference --      Peak Flow --      Pain Score 07/25/22 0901 1     Pain Loc --      Pain Edu? --      Excl. in [place]? --    No data found.  Updated Vital Signs BP 122/78 (BP Location: Right Arm)   Pulse 60   Temp 97.9 F (36.6 C) (Oral)   Resp 18   LMP  (LMP Unknown)   SpO2 98%   Visual Acuity Right Eye Distance:   Left Eye Distance:   Bilateral Distance:    Right Eye Near:   Left Eye Near:    Bilateral Near:     Physical Exam Vitals and nursing note reviewed.  Constitutional:      Appearance: Normal appearance. She is not ill-appearing.  HENT:     Head: Atraumatic.     Left Ear: Tympanic membrane normal.     Ears:     Comments: Right middle ear effusion    Nose: Nose normal.     Mouth/Throat:     Mouth: Mucous membranes are moist.     Pharynx: Oropharynx is clear.  Eyes:     Extraocular Movements: Extraocular movements intact.     Conjunctiva/sclera: Conjunctivae normal.  Cardiovascular:     Rate and Rhythm: Normal rate and regular rhythm.     Heart sounds: Normal heart sounds.  Pulmonary:     Effort: Pulmonary effort is normal.     Breath sounds: Normal breath sounds.  Musculoskeletal:        General: Normal range of motion.     Cervical back: Normal range of motion and neck supple.  Skin:    General: Skin is warm and dry.  Neurological:     Mental Status: She is alert and oriented to person, place, and time.  Psychiatric:        Mood and Affect: Mood normal.        Thought Content:  Thought content normal.        Judgment: Judgment normal.      UC Treatments / Results  Labs (all labs ordered are listed, but only abnormal results are displayed) Labs Reviewed - No data to display  EKG   Radiology No results found.  Procedures Procedures (including critical care time)  Medications Ordered in UC Medications - No data to display  Initial Impression / Assessment and Plan / UC Course  I have reviewed the triage vital signs and the nursing notes.  Pertinent labs & imaging results that were available during my care of the patient were reviewed by me and considered in my medical decision making (see chart for details).     Treat with short course of prednisone for eustachian tube dysfunction, continued allergy regimen and supportive over-the-counter medications and home care.  Return for worsening symptoms.  Final Clinical Impressions(s) / UC Diagnoses   Final diagnoses:  Acute dysfunction of right eustachian tube     Discharge Instructions      Continue taking Zyrtec and Flonase daily, recommend the Flonase twice daily as they feel this works better than once daily.  You may also take decongestants as needed to help with the pressure.  I have sent several days of prednisone over in hopes of bringing relief sooner and opening up those eustachian tubes faster  ED Prescriptions     Medication Sig Dispense Auth. Provider   predniSONE (DELTASONE) 50 MG tablet Take 1 tab daily with breakfast for 3 days 3 tablet Volney American, [place]      PDMP not reviewed this encounter.   Volney American, [place] 07/25/22 1026

## 2022-07-25 NOTE — Discharge Instructions (Signed)
Continue taking Zyrtec and Flonase daily, recommend the Flonase twice daily as they feel this works better than once daily.  You may also take decongestants as needed to help with the pressure.  I have sent several days of prednisone over in hopes of bringing relief sooner and opening up those eustachian tubes faster

## 2022-08-05 DIAGNOSIS — H43823 Vitreomacular adhesion, bilateral: Secondary | ICD-10-CM | POA: Diagnosis not present

## 2022-08-05 DIAGNOSIS — H35453 Secondary pigmentary degeneration, bilateral: Secondary | ICD-10-CM | POA: Diagnosis not present

## 2022-08-19 DIAGNOSIS — Z6827 Body mass index (BMI) 27.0-27.9, adult: Secondary | ICD-10-CM | POA: Diagnosis not present

## 2022-08-19 DIAGNOSIS — R030 Elevated blood-pressure reading, without diagnosis of hypertension: Secondary | ICD-10-CM | POA: Diagnosis not present

## 2022-08-19 DIAGNOSIS — H6991 Unspecified Eustachian tube disorder, right ear: Secondary | ICD-10-CM | POA: Diagnosis not present

## 2022-09-03 ENCOUNTER — Other Ambulatory Visit (HOSPITAL_COMMUNITY): Payer: Self-pay | Admitting: Family Medicine

## 2022-09-03 DIAGNOSIS — H7391 Unspecified disorder of tympanic membrane, right ear: Secondary | ICD-10-CM

## 2022-09-03 DIAGNOSIS — Z6827 Body mass index (BMI) 27.0-27.9, adult: Secondary | ICD-10-CM | POA: Diagnosis not present

## 2022-09-03 DIAGNOSIS — H9319 Tinnitus, unspecified ear: Secondary | ICD-10-CM

## 2022-09-03 DIAGNOSIS — R030 Elevated blood-pressure reading, without diagnosis of hypertension: Secondary | ICD-10-CM | POA: Diagnosis not present

## 2022-09-03 DIAGNOSIS — H6991 Unspecified Eustachian tube disorder, right ear: Secondary | ICD-10-CM | POA: Diagnosis not present

## 2022-09-05 ENCOUNTER — Encounter (HOSPITAL_BASED_OUTPATIENT_CLINIC_OR_DEPARTMENT_OTHER): Payer: Self-pay

## 2022-09-05 ENCOUNTER — Ambulatory Visit (HOSPITAL_BASED_OUTPATIENT_CLINIC_OR_DEPARTMENT_OTHER)
Admission: RE | Admit: 2022-09-05 | Discharge: 2022-09-05 | Disposition: A | Discharge status: Home / Self Care | Payer: Commercial Managed Care - PPO | Source: Ambulatory Visit | Attending: Family Medicine | Admitting: Family Medicine

## 2022-09-05 DIAGNOSIS — H9319 Tinnitus, unspecified ear: Secondary | ICD-10-CM

## 2022-09-05 DIAGNOSIS — H7391 Unspecified disorder of tympanic membrane, right ear: Secondary | ICD-10-CM

## 2022-09-08 ENCOUNTER — Ambulatory Visit (HOSPITAL_BASED_OUTPATIENT_CLINIC_OR_DEPARTMENT_OTHER)
Admission: RE | Admit: 2022-09-08 | Discharge: 2022-09-08 | Disposition: A | Discharge status: Home / Self Care | Payer: Commercial Managed Care - PPO | Source: Ambulatory Visit | Attending: Family Medicine | Admitting: Family Medicine

## 2022-09-08 ENCOUNTER — Encounter (HOSPITAL_BASED_OUTPATIENT_CLINIC_OR_DEPARTMENT_OTHER): Payer: Self-pay

## 2022-09-08 DIAGNOSIS — H7391 Unspecified disorder of tympanic membrane, right ear: Secondary | ICD-10-CM | POA: Insufficient documentation

## 2022-09-08 DIAGNOSIS — H9319 Tinnitus, unspecified ear: Secondary | ICD-10-CM | POA: Insufficient documentation

## 2022-09-08 DIAGNOSIS — H938X9 Other specified disorders of ear, unspecified ear: Secondary | ICD-10-CM | POA: Diagnosis not present

## 2022-09-10 ENCOUNTER — Encounter (HOSPITAL_COMMUNITY): Payer: Self-pay

## 2022-09-10 ENCOUNTER — Emergency Department (HOSPITAL_COMMUNITY)
Admission: EM | Admit: 2022-09-10 | Discharge: 2022-09-10 | Disposition: A | Discharge status: Home / Self Care | Payer: Commercial Managed Care - PPO | Attending: Emergency Medicine | Admitting: Emergency Medicine

## 2022-09-10 ENCOUNTER — Other Ambulatory Visit: Payer: Self-pay

## 2022-09-10 DIAGNOSIS — Z5321 Procedure and treatment not carried out due to patient leaving prior to being seen by health care provider: Secondary | ICD-10-CM | POA: Diagnosis not present

## 2022-09-10 DIAGNOSIS — H9201 Otalgia, right ear: Secondary | ICD-10-CM | POA: Diagnosis not present

## 2022-09-10 NOTE — ED Provider Triage Note (Signed)
Emergency Medicine Provider Triage Evaluation Note  April Jordan , a 52 y.o. female  was evaluated in triage.  Pt complains of right ear pain for the past 2 months, had a round of antibiotics, steroids, evaluated by her PCP.  Does have an ENT appointment on June 15, however reports that she cannot wait this long. Now has trouble hearing to the RIGHT ear  Review of Systems  Positive: Ear pain Negative: headache  Physical Exam  BP (!) 155/83 (BP Location: Right Arm)   Pulse 84   Temp 98.2 F (36.8 C) (Oral)   Resp 17   Ht 5\' 2"  (1.575 m)   Wt 68 kg   LMP  (LMP Unknown)   SpO2 99%   BMI 27.44 kg/m  Gen:   Awake, no distress   Resp:  Normal effort  MSK:   Moves extremities without difficulty  Other:  BL Tms are slightly erythematous.  No pain along the mastoid bilaterally  Medical Decision Making  Medically screening exam initiated at 2:58 PM.  Appropriate orders placed.  Mieko Hamre was informed that the remainder of the evaluation will be completed by another provider, this initial triage assessment does not replace that evaluation, and the importance of remaining in the ED until their evaluation is complete.  Recent CT done outpatient.    Claude Manges, PA-C 09/10/22 1503

## 2022-09-10 NOTE — ED Triage Notes (Signed)
Pt arrives via POV. Pt reports pain, drainage, and pressure to right ear for the past 3 months. Pt states symptoms are not improving. Pt arrives with hopes of seeing an ENT. Pt had an outpatient ct on the 13th. Pt AxOx4.

## 2022-09-10 NOTE — Discharge Instructions (Addendum)
You came to the emergency department today with right ear pain.  Unfortunately we do not have ENT physicians who come and see patients for nonemergent presentations.  I have attached an office for you to call for an appointment.  Call them in the morning and see if they can get you in sometime this week.  If they are unable to see you your family doctor should be able to help you.  You may use Tylenol and ibuprofen for any further pain.  Do not hesitate to return to the emergency department with worsening symptoms, especially fevers, chills and heart racing.

## 2022-09-15 DIAGNOSIS — H6090 Unspecified otitis externa, unspecified ear: Secondary | ICD-10-CM | POA: Diagnosis not present

## 2022-09-15 DIAGNOSIS — H6121 Impacted cerumen, right ear: Secondary | ICD-10-CM | POA: Diagnosis not present

## 2022-09-30 ENCOUNTER — Other Ambulatory Visit (HOSPITAL_COMMUNITY): Payer: Self-pay

## 2023-01-15 DIAGNOSIS — Z13220 Encounter for screening for lipoid disorders: Secondary | ICD-10-CM | POA: Diagnosis not present

## 2023-01-15 DIAGNOSIS — E039 Hypothyroidism, unspecified: Secondary | ICD-10-CM | POA: Diagnosis not present

## 2023-01-15 DIAGNOSIS — R7989 Other specified abnormal findings of blood chemistry: Secondary | ICD-10-CM | POA: Diagnosis not present

## 2023-01-15 DIAGNOSIS — D75839 Thrombocytosis, unspecified: Secondary | ICD-10-CM | POA: Diagnosis not present

## 2023-01-20 DIAGNOSIS — Z0001 Encounter for general adult medical examination with abnormal findings: Secondary | ICD-10-CM | POA: Diagnosis not present

## 2023-01-20 DIAGNOSIS — E039 Hypothyroidism, unspecified: Secondary | ICD-10-CM | POA: Diagnosis not present

## 2023-01-20 DIAGNOSIS — E7849 Other hyperlipidemia: Secondary | ICD-10-CM | POA: Diagnosis not present

## 2023-01-20 DIAGNOSIS — Z90721 Acquired absence of ovaries, unilateral: Secondary | ICD-10-CM | POA: Diagnosis not present

## 2023-01-20 DIAGNOSIS — Z6828 Body mass index (BMI) 28.0-28.9, adult: Secondary | ICD-10-CM | POA: Diagnosis not present

## 2023-01-20 DIAGNOSIS — Z90710 Acquired absence of both cervix and uterus: Secondary | ICD-10-CM | POA: Diagnosis not present

## 2023-01-20 DIAGNOSIS — R030 Elevated blood-pressure reading, without diagnosis of hypertension: Secondary | ICD-10-CM | POA: Diagnosis not present

## 2023-01-20 DIAGNOSIS — Z1331 Encounter for screening for depression: Secondary | ICD-10-CM | POA: Diagnosis not present

## 2023-01-20 DIAGNOSIS — D75839 Thrombocytosis, unspecified: Secondary | ICD-10-CM | POA: Diagnosis not present

## 2023-01-20 DIAGNOSIS — Z1389 Encounter for screening for other disorder: Secondary | ICD-10-CM | POA: Diagnosis not present

## 2023-04-02 ENCOUNTER — Other Ambulatory Visit (HOSPITAL_COMMUNITY): Payer: Self-pay

## 2023-04-02 DIAGNOSIS — Z01419 Encounter for gynecological examination (general) (routine) without abnormal findings: Secondary | ICD-10-CM | POA: Diagnosis not present

## 2023-04-02 DIAGNOSIS — Z1331 Encounter for screening for depression: Secondary | ICD-10-CM | POA: Diagnosis not present

## 2023-04-02 DIAGNOSIS — Z1231 Encounter for screening mammogram for malignant neoplasm of breast: Secondary | ICD-10-CM | POA: Diagnosis not present

## 2023-04-02 MED ORDER — ESTRADIOL 0.05 MG/24HR TD PTWK
0.0500 mg | MEDICATED_PATCH | TRANSDERMAL | 3 refills | Status: AC
  Filled 2023-04-02: qty 12, 84d supply, fill #0
  Filled 2023-06-22: qty 12, 84d supply, fill #1
  Filled 2023-08-26: qty 12, 84d supply, fill #2
  Filled 2023-09-08: qty 4, 28d supply, fill #2
  Filled 2023-10-19: qty 4, 28d supply, fill #3
  Filled 2023-11-10: qty 4, 28d supply, fill #4
  Filled 2023-12-08: qty 4, 28d supply, fill #5
  Filled 2024-01-07: qty 4, 28d supply, fill #6
  Filled 2024-02-23 – 2024-02-24 (×2): qty 4, 28d supply, fill #7

## 2023-04-12 ENCOUNTER — Ambulatory Visit
Admission: EM | Admit: 2023-04-12 | Discharge: 2023-04-12 | Disposition: A | Discharge status: Home / Self Care | Payer: Commercial Managed Care - PPO | Attending: Family Medicine | Admitting: Family Medicine

## 2023-04-12 DIAGNOSIS — L309 Dermatitis, unspecified: Secondary | ICD-10-CM

## 2023-04-12 MED ORDER — METHYLPREDNISOLONE SODIUM SUCC 125 MG IJ SOLR
80.0000 mg | Freq: Once | INTRAMUSCULAR | Status: AC
  Administered 2023-04-12: 80 mg via INTRAMUSCULAR

## 2023-04-12 NOTE — ED Provider Notes (Signed)
[facility]    CSN: [id number] Arrival date & time: 04/12/23  4098      History   Chief Complaint No chief complaint on file.   HPI April Jordan is a 52 y.o. female.   Patient with past medical history significant for atopic dermatitis previously followed by dermatology but not over the past year or so since dermatology office closed presenting today with itchy irritated facial rash that is consistent with her past eczema flares.  Denies any new topical products, foods, medications or supplements.  So far trying moisturization with minimal relief.    Past Medical History:  Diagnosis Date   Abnormal Pap smear of cervix 06/22/2017   [facility], -HPV, will get colpo   Complication of anesthesia    Heart murmur    Hypothyroidism    Mitral valve prolapse 1986   Mucous membrane pemphigoid 1999   Pemphigoid    PONV (postoperative nausea and vomiting)    Thyroid disease    hypothyroid   Vaginal Pap smear, abnormal     Patient Active Problem List   Diagnosis Date Noted   Current use of estrogen therapy 10/24/2020   S/P hysterectomy with oophorectomy 10/24/2020   Encounter for screening fecal occult blood testing 10/24/2020   Encounter for well woman exam with routine gynecological exam 10/24/2020   BV (bacterial vaginosis) 10/24/2020   Physical exam 10/21/2018   Screening for colorectal cancer 10/21/2018   S/P TAH-BSO 05/12/2018   S/P TAH-BSO (total abdominal hysterectomy and bilateral salpingo-oophorectomy) 05/12/2018   History of abnormal cervical Pap smear 03/22/2018   S/P laser of cervix 03/22/2018   Dyspareunia in female 03/22/2018   Hot flashes 03/22/2018   Abnormal Pap smear of cervix 06/22/2017   Thyroid nodule 06/16/2017   Encounter for gynecological examination with Papanicolaou smear of cervix 06/16/2017   Hypothyroidism 03/10/2013    Past Surgical History:  Procedure Laterality Date   ABDOMINAL HYSTERECTOMY     ABDOMINAL HYSTERECTOMY N/A  05/12/2018   Procedure: HYSTERECTOMY ABDOMINAL;  Surgeon: Lazaro Arms, MD;  Location: [facility];  Service: Gynecology;  Laterality: N/A;   CERVICAL CONIZATION W/BX N/A 08/12/2017   Procedure: LASER CONIZATION CERVIX WITH BIOPSY;  Surgeon: Lazaro Arms, MD;  Location: [facility];  Service: Gynecology;  Laterality: N/A;   CERVICAL DISC SURGERY     CHOLECYSTECTOMY N/A 07/13/2013   Procedure: LAPAROSCOPIC CHOLECYSTECTOMY;  Surgeon: Dalia Heading, MD;  Location: [facility];  Service: General;  Laterality: N/A;   COLPOSCOPY     SALPINGOOPHORECTOMY Bilateral 05/12/2018   Procedure: BILATERAL SALPINGO OOPHORECTOMY;  Surgeon: Lazaro Arms, MD;  Location: [facility];  Service: Gynecology;  Laterality: Bilateral;   TUBAL LIGATION     uterine ablation      OB History     Gravida  1   Para  1   Term  1   Preterm      AB      Living  1      SAB      IAB      Ectopic      Multiple      Live Births               Home Medications    Prior to Admission medications   Medication Sig Start Date End Date Taking? Authorizing Provider  acetaminophen (TYLENOL) 500 MG tablet Take 1,000 mg by mouth every 6 (six) hours as needed for moderate pain or headache.    [provider]  aspirin-acetaminophen-caffeine (EXCEDRIN MIGRAINE) [phone] MG per tablet Take 2 tablets by mouth every 6 (six) hours as needed for headache.    [provider]  conjugated estrogens (PREMARIN) vaginal cream Use 0.5 gram in vagina 2-3 times a week. 10/24/20   Adline Potter, NP  conjugated estrogens (PREMARIN) vaginal cream Place 0.5 g vaginally 2 (two) times a week as directed. 03/03/22     cyclobenzaprine (FLEXERIL) 10 MG tablet Take 1 tablet (10 mg total) by mouth 3 (three) times daily as needed for muscle spasms. 12/29/19   Muthersbaugh, Dahlia Client, PA-C  estradiol (CLIMARA - DOSED IN MG/24 HR) 0.05 mg/24hr patch Place 1 patch (0.05 mg total) onto the skin once a week. 03/27/22     estradiol (CLIMARA -  DOSED IN MG/24 HR) 0.05 mg/24hr patch Place 1 patch (0.05 mg total) onto the skin once a week. 07/03/22     estradiol (CLIMARA - DOSED IN MG/24 HR) 0.05 mg/24hr patch Place 1 patch (0.05 mg total) onto the skin once a week. 04/02/23     estradiol (ESTRACE) 2 MG tablet TAKE ONE (1) TABLET BY MOUTH EVERY DAY 10/31/21   Adline Potter, NP  famotidine (PEPCID) 40 MG tablet Take 1 tablet (40 mg total) by mouth 2 (two) times daily. 03/15/21     fluticasone (FLONASE) 50 MCG/ACT nasal spray Place 1 spray into both nostrils daily.    [provider]  hydrOXYzine (ATARAX) 25 MG tablet Take 1 tablet (25 mg total) by mouth every 8 (eight) hours as needed 03/15/21     levocetirizine (XYZAL) 5 MG tablet Take 5 mg by mouth every evening.    [provider]  levocetirizine (XYZAL) 5 MG tablet Take 1 tablet (5 mg total) by mouth every evening. 03/15/21     metroNIDAZOLE (METROGEL VAGINAL) 0.75 % vaginal gel Place 1 Applicatorful vaginally at bedtime. 10/24/20   Adline Potter, NP  naproxen (NAPROSYN) 500 MG tablet Take 1 tablet (500 mg total) by mouth 2 (two) times daily with a meal. 12/29/19   Muthersbaugh, Dahlia Client, PA-C  pimecrolimus (ELIDEL) 1 % cream Apply 1 application topically 2 (two) times daily. 03/15/21     predniSONE (DELTASONE) 50 MG tablet Take 1 tab daily with breakfast for 3 days 07/25/22   Particia Nearing, PA-C  Ruxolitinib Phosphate (OPZELURA) 1.5 % CREA Apply 1 application topically to the affected area daily. 09/25/21   Sheffield, Kelli R, PA-C  SYNTHROID 150 MCG tablet Take 1 tablet (150 mcg total) by mouth every morning. 11/29/21   Margaretann Loveless, PA-C  triamcinolone cream (KENALOG) 0.1 % Apply 1 application externally 2 (two) times daily. 03/15/21     TURMERIC PO Take 1 tablet by mouth daily.    [provider]    Family History Family History  Problem Relation Age of Onset   Hypertension Mother    Hypertension Father    Hypertension Paternal  Grandmother     Social History Social History   Tobacco Use   Smoking status: Never   Smokeless tobacco: Never  Substance Use Topics   Alcohol use: Yes    Comment: socially   Drug use: No     Allergies   Amoxicillin, Chlorhexidine gluconate, Ciprofloxacin, Levaquin [levofloxacin], and Sulfa antibiotics   Review of Systems Review of Systems Per HPI  Physical Exam Triage Vital Signs ED Triage Vitals  Encounter Vitals Group     BP 04/12/23 0949 116/60     Systolic BP Percentile --  Diastolic BP Percentile --      Pulse Rate 04/12/23 0949 (!) 59     Resp 04/12/23 0949 14     Temp 04/12/23 0949 98 F (36.7 C)     Temp Source 04/12/23 0949 Oral     SpO2 04/12/23 0949 98 %     Weight --      Height --      Head Circumference --      Peak Flow --      Pain Score 04/12/23 0950 0     Pain Loc --      Pain Education --      Exclude from Growth Chart --    No data found.  Updated Vital Signs BP 116/60 (BP Location: Right Arm)   Pulse (!) 59   Temp 98 F (36.7 C) (Oral)   Resp 14   LMP  (LMP Unknown)   SpO2 98%   Visual Acuity Right Eye Distance:   Left Eye Distance:   Bilateral Distance:    Right Eye Near:   Left Eye Near:    Bilateral Near:     Physical Exam Vitals and nursing note reviewed.  Constitutional:      Appearance: Normal appearance. She is not ill-appearing.  HENT:     Head: Atraumatic.  Eyes:     Extraocular Movements: Extraocular movements intact.     Conjunctiva/sclera: Conjunctivae normal.  Cardiovascular:     Rate and Rhythm: Normal rate and regular rhythm.     Heart sounds: Normal heart sounds.  Pulmonary:     Effort: Pulmonary effort is normal.     Breath sounds: Normal breath sounds.  Musculoskeletal:        General: Normal range of motion.     Cervical back: Normal range of motion and neck supple.  Skin:    General: Skin is warm and dry.     Findings: Rash present.     Comments: Erythematous dry patches across face  particularly around eyes and upper cheeks, forehead  Neurological:     Mental Status: She is alert and oriented to person, place, and time.  Psychiatric:        Mood and Affect: Mood normal.        Thought Content: Thought content normal.        Judgment: Judgment normal.      UC Treatments / Results  Labs (all labs ordered are listed, but only abnormal results are displayed) Labs Reviewed - No data to display  EKG   Radiology No results found.  Procedures Procedures (including critical care time)  Medications Ordered in UC Medications  methylPREDNISolone sodium succinate (SOLU-MEDROL) 125 mg/2 mL injection 80 mg (80 mg Intramuscular Given 04/12/23 1047)    Initial Impression / Assessment and Plan / UC Course  I have reviewed the triage vital signs and the nursing notes.  Pertinent labs & imaging results that were available during my care of the patient were reviewed by me and considered in my medical decision making (see chart for details).     Will treat with IM Solu-Medrol, good moisturization regimen, hydrocortisone cream topically as needed, avoidance of any irritating or scented products.  Follow-up with new dermatologist as soon as possible for recheck.  Final Clinical Impressions(s) / UC Diagnoses   Final diagnoses:  Eczema, unspecified type     Discharge Instructions      Have given you a steroid shot today.  Continue good moisturization, avoidance of irritating or scented  facial products or perfumes, detergents and other environmental exposures.  You may also use hydrocortisone over-the-counter cream here and there to spot treat the areas.    ED Prescriptions   None    PDMP not reviewed this encounter.   Particia Nearing, [place] 04/12/23 1114

## 2023-04-12 NOTE — ED Triage Notes (Signed)
Pt reports she is having an eczema flare up around her eyes x 1 week

## 2023-04-12 NOTE — Discharge Instructions (Signed)
Have given you a steroid shot today.  Continue good moisturization, avoidance of irritating or scented facial products or perfumes, detergents and other environmental exposures.  You may also use hydrocortisone over-the-counter cream here and there to spot treat the areas.

## 2023-07-28 DIAGNOSIS — S338XXA Sprain of other parts of lumbar spine and pelvis, initial encounter: Secondary | ICD-10-CM | POA: Diagnosis not present

## 2023-07-28 DIAGNOSIS — S134XXA Sprain of ligaments of cervical spine, initial encounter: Secondary | ICD-10-CM | POA: Diagnosis not present

## 2023-07-28 DIAGNOSIS — S233XXA Sprain of ligaments of thoracic spine, initial encounter: Secondary | ICD-10-CM | POA: Diagnosis not present

## 2023-08-06 DIAGNOSIS — S338XXA Sprain of other parts of lumbar spine and pelvis, initial encounter: Secondary | ICD-10-CM | POA: Diagnosis not present

## 2023-08-06 DIAGNOSIS — S134XXA Sprain of ligaments of cervical spine, initial encounter: Secondary | ICD-10-CM | POA: Diagnosis not present

## 2023-08-06 DIAGNOSIS — S233XXA Sprain of ligaments of thoracic spine, initial encounter: Secondary | ICD-10-CM | POA: Diagnosis not present

## 2023-08-26 ENCOUNTER — Other Ambulatory Visit (HOSPITAL_COMMUNITY): Payer: Self-pay

## 2023-09-08 ENCOUNTER — Other Ambulatory Visit (HOSPITAL_COMMUNITY): Payer: Self-pay

## 2023-09-16 ENCOUNTER — Other Ambulatory Visit (HOSPITAL_COMMUNITY): Payer: Self-pay

## 2023-10-19 ENCOUNTER — Other Ambulatory Visit (HOSPITAL_COMMUNITY): Payer: Self-pay

## 2023-10-20 ENCOUNTER — Other Ambulatory Visit (HOSPITAL_COMMUNITY): Payer: Self-pay

## 2024-02-24 ENCOUNTER — Other Ambulatory Visit (HOSPITAL_COMMUNITY): Payer: Self-pay

## 2024-02-24 ENCOUNTER — Other Ambulatory Visit (HOSPITAL_BASED_OUTPATIENT_CLINIC_OR_DEPARTMENT_OTHER): Payer: Self-pay
